# Patient Record
Sex: Female | Born: 1995 | Hispanic: Yes | Marital: Married | Smoking: Former smoker
Health system: Southern US, Community
[De-identification: ages and names within clinical notes are randomized; demographics above are authoritative.]

## PROBLEM LIST (undated history)

## (undated) ENCOUNTER — Inpatient Hospital Stay (HOSPITAL_COMMUNITY): Payer: Self-pay

## (undated) DIAGNOSIS — B999 Unspecified infectious disease: Secondary | ICD-10-CM

## (undated) DIAGNOSIS — F419 Anxiety disorder, unspecified: Secondary | ICD-10-CM

## (undated) DIAGNOSIS — R011 Cardiac murmur, unspecified: Secondary | ICD-10-CM

## (undated) HISTORY — PX: NO PAST SURGERIES: SHX2092

## (undated) HISTORY — PX: WISDOM TOOTH EXTRACTION: SHX21

## (undated) HISTORY — DX: Cardiac murmur, unspecified: R01.1

---
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
## 2015-05-16 ENCOUNTER — Ambulatory Visit (INDEPENDENT_AMBULATORY_CARE_PROVIDER_SITE_OTHER): Payer: Medicaid Other | Admitting: Pediatrics

## 2015-05-16 ENCOUNTER — Encounter: Payer: Self-pay | Admitting: Pediatrics

## 2015-05-16 VITALS — Temp 98.4°F | Wt 131.4 lb

## 2015-05-16 DIAGNOSIS — L03213 Periorbital cellulitis: Secondary | ICD-10-CM | POA: Diagnosis not present

## 2015-05-16 MED ORDER — CLINDAMYCIN HCL 300 MG PO CAPS: 300.0000 mg | ORAL_CAPSULE | Freq: Three times a day (TID) | ORAL | Status: AC

## 2015-05-16 NOTE — Patient Instructions (Signed)
Preseptal Cellulitis, Adult °Preseptal cellulitis--also called periorbital cellulitis--is an infection that can affect your eyelid and the soft tissues or skin that surround your eye. The infection may also affect the structures that produce and drain your tears. It does not affect your eye itself. °CAUSES °This condition may be caused by: °· Bacterial infection. °· Long-term (chronic) sinus infections. °· An object (foreign body) that is stuck behind the eye. °· An injury that: °¨ Goes through the eyelid tissues. °¨ Causes an infection, such as an insect sting. °· Fracture of the bone around the eye. °· Infections that have spread from the eyelid or other structures around the eye. °· Bite wounds. °· Inflammation or infection of the lining membranes of the brain (meningitis). °· An infection in the blood (septicemia). °· Dental infection (abscess). °· Viral infection. This is rare. °RISK FACTORS °Risk factors for preseptal cellulitis include: °· Participating in activities that increase your risk of trauma to the face or head, such as boxing or high-speed activities. °· Having a weakened defense system (immune system). °· Medical conditions, such as nasal polyps, that increase your risk for frequent or recurrent sinus infections. °· Not receiving regular dental care. °SYMPTOMS °Symptoms of this condition usually come on suddenly. Symptoms may include: °· Red, hot, and swollen eyelids. °· Fever. °· Difficulty opening your eye. °· Eye pain. °DIAGNOSIS °This condition may be diagnosed by an eye exam. You may also have tests, such as: °· Blood tests. °· CT scan. °· MRI. °· Spinal tap (lumbar puncture). This is a procedure that involves removing and examining a small amount of the fluid that surrounds the brain and spinal cord. This checks for meningitis. °TREATMENT °Treatment for this condition will include antibiotic medicines. These may be given by mouth (orally), through an IV, or as a shot. Your health care  provider may also recommend nasal decongestants to reduce swelling. °HOME CARE INSTRUCTIONS °· Take your antibiotic medicine as directed by your health care provider. Finish all of it even if you start to feel better. °· Take medicines only as directed by your health care provider. °· Drink enough fluid to keep your urine clear or pale yellow. °· Do not use any tobacco products, including cigarettes, chewing tobacco, or electronic cigarettes. If you need help quitting, ask your health care provider. °· Keep all follow-up visits as directed by your health care provider. These include any visits with an eye specialist (ophthalmologist) or dentist. °SEEK MEDICAL CARE IF: °· You have a fever. °· Your eyelids become more red, warm, or swollen. °· You have new symptoms. °· Your symptoms do not get better with treatment. °SEEK IMMEDIATE MEDICAL CARE IF: °· You develop double vision, or your vision becomes blurred or worsens in any way. °· You have trouble moving your eyes. °· Your eye looks like it is sticking out or bulging out (proptosis). °· You develop a severe headache, severe neck pain, or neck stiffness. °· You develop repeated vomiting. °  °This information is not intended to replace advice given to you by your health care provider. Make sure you discuss any questions you have with your health care provider. °  °Document Released: 02/06/2010 Document Revised: 05/21/2014 Document Reviewed: 12/31/2013 °Elsevier Interactive Patient Education ©2016 Elsevier Inc. ° °

## 2015-05-16 NOTE — Progress Notes (Addendum)
Subjective:     Patient ID: Peggy ConesGriselda Torres, female   DOB: [birth year], 20 y.o.   MRN: [id number]  CC: eye swelling  HPI   Peggy Torres is a previously healthy 20 y.o. female previously healthy who presents with a swollen left eyelid.  Swollen Left eyelid: Says she was scratching her eye 3 days ago. The day after she noticed a dull, non-radiating eye pain 6/10 when she woke up. She thinks the pain has improved since it initially began. She says it may be be related to wearing fake eyelashes which she started using for the first time two weeks ago. She says she started wearing new eyelashes 2 days ago and says she placed them on both eyes. She has been scratching at her eyes. Her eyelid hurts when touched and when she blinks. She has tried running her eye under warm water but says it does not help. She has no difficulty opening her eye in the morning. She has not tried treating it with any medication and says this has never happened to her before. Denies fever, nausea, vomiting, diarrhea, loss of vision, discharge, itchiness, use of contact lenses, eye redness, allergies, or sick contacts.  Review of Systems  Constitutional: Negative for chills and appetite change.  HENT: Negative for congestion.   Eyes: Negative for pain, discharge and itching.  Respiratory: Negative for shortness of breath.   Cardiovascular: Negative for chest pain.  Gastrointestinal: Negative for vomiting.  Genitourinary: Negative for decreased urine volume.  Musculoskeletal: Negative for gait problem.  Neurological: Negative for dizziness and headaches.   Normal other than in HPI    Objective:   Physical Exam  Constitutional: She is oriented to person, place, and time. She appears well-developed and well-nourished.  HENT:  Erythematous, edematous left eyelid that is tender to touch with no conjunctival involvement. Normocephalic, MMM, pink conjunctivae. No rhinorrhea.    Eyes: EOM are normal. Pupils are equal, round, and  reactive to light. Right eye exhibits no discharge. Left eye exhibits no discharge.  Neck: Normal range of motion. Neck supple.  Cardiovascular: Normal rate and regular rhythm.   Pulmonary/Chest: Effort normal and breath sounds normal. No respiratory distress.  Abdominal: Soft. Bowel sounds are normal. She exhibits no distension. There is no tenderness.  Musculoskeletal: Normal range of motion. She exhibits no edema.  Neurological: She is alert and oriented to person, place, and time. Coordination normal.  Skin: Skin is warm and dry.  See HEENT section.     Filed Vitals:   05/16/15 1404  Temp: 98.4 F (36.9 C)  Temperature 98.4 F (36.9 C), temperature source Temporal, weight 131 lb 6.4 oz (59.603 kg).    Assessment:     Peggy Torres is a 20 y.o. Female previously healthy who presents with a swollen left eye concerning for preseptal cellulitis given history of scratching prior to onset of symptoms, unilateral eyelid involvement, no sick contacts, no eye discharge, and no conjunctival involvement. She has no signs or symptoms concerning for orbital cellulitis a this time. Will prescribe clindamycin and provide strict return precautions.     Plan:     Left Preseptal Cellulitis:  -Recommend treatment with Clindamycin 3 times a day for 10 days.  -Discussed return precautions  Need for vaccination: Unable to administer adult vaccinations at our clinic today, but discussed going to health department to receive needed vaccinations. Discussed need for HPV and Hepatitis A vaccinations.      I personally saw and evaluated the patient, and participated in the management  and treatment plan as documented in the medical student's note.  Orie Rout B 05/17/2015 7:04 AM

## 2015-05-17 NOTE — Progress Notes (Signed)
Subjective:     Patient ID: Peggy ConesGriselda Torres, female   DOB: [birth year], 20 y.o.   MRN: [id number]  CC: eye swelling  Eye Problem  Pertinent negatives include no eye discharge or vomiting.     Peggy Torres is a previously healthy 20 y.o. female previously healthy who presents with a swollen left eyelid.  Swollen Left eyelid: Says she was scratching her eye 3 days ago. The day after she noticed a dull, non-radiating eye pain 6/10 when she woke up. She thinks the pain has improved since it initially began. She says it may be be related to wearing fake eyelashes which she started using for the first time two weeks ago. She says she started wearing new eyelashes 2 days ago and says she placed them on both eyes. She has been scratching at her eyes. Her eyelid hurts when touched and when she blinks. She has tried running her eye under warm water but says it does not help. She has no difficulty opening her eye in the morning. She has not tried treating it with any medication and says this has never happened to her before. Denies fever, nausea, vomiting, diarrhea, loss of vision, discharge, itchiness, use of contact lenses, eye redness, allergies, or sick contacts.  Review of Systems  Constitutional: Negative for chills and appetite change.  HENT: Negative for congestion.   Eyes: Negative for pain, discharge and itching.  Respiratory: Negative for shortness of breath.   Cardiovascular: Negative for chest pain.  Gastrointestinal: Negative for vomiting.  Genitourinary: Negative for decreased urine volume.  Musculoskeletal: Negative for gait problem.  Neurological: Negative for dizziness and headaches.   Normal other than in HPI    Objective:   Physical Exam  Constitutional: She is oriented to person, place, and time. She appears well-developed and well-nourished.  HENT:  Erythematous, edematous left eyelid that is tender to touch with no conjunctival involvement. Normocephalic, MMM, pink conjunctivae.  No rhinorrhea.    Eyes: EOM are normal. Pupils are equal, round, and reactive to light. Right eye exhibits no discharge. Left eye exhibits no discharge.  Neck: Normal range of motion. Neck supple.  Cardiovascular: Normal rate and regular rhythm.   Pulmonary/Chest: Effort normal and breath sounds normal. No respiratory distress.  Abdominal: Soft. Bowel sounds are normal. She exhibits no distension. There is no tenderness.  Musculoskeletal: Normal range of motion. She exhibits no edema.  Neurological: She is alert and oriented to person, place, and time. Coordination normal.  Skin: Skin is warm and dry.  See HEENT section.     Filed Vitals:   05/16/15 1404  Temp: 98.4 F (36.9 C)  Temperature 98.4 F (36.9 C), temperature source Temporal, weight 131 lb 6.4 oz (59.603 kg).    Assessment:     Peggy Torres is a 20 y.o. Female previously healthy who presents with a swollen left eye concerning for preseptal cellulitis given history of scratching prior to onset of symptoms, unilateral eyelid involvement, no sick contacts, no eye discharge, and no conjunctival involvement. She has no signs or symptoms concerning for orbital cellulitis a this time. Will prescribe clindamycin and provide strict return precautions.     Plan:     Left Preseptal Cellulitis:  -Recommend treatment with Clindamycin 3 times a day for 10 days.  -Discussed return precautions  Need for vaccination: Unable to administer adult vaccinations at our clinic today, but discussed going to health department to receive needed vaccinations. Discussed need for HPV and Hepatitis A vaccinations.  I saw and examined the patient, agree with the medical student and have made any necessary additions or changes to the above note.

## 2015-06-24 ENCOUNTER — Encounter: Payer: Self-pay | Admitting: Pediatrics

## 2015-06-24 ENCOUNTER — Ambulatory Visit (INDEPENDENT_AMBULATORY_CARE_PROVIDER_SITE_OTHER): Payer: Medicaid Other | Admitting: Pediatrics

## 2015-06-24 VITALS — BP 104/62 | Ht 63.5 in | Wt 134.0 lb

## 2015-06-24 DIAGNOSIS — R6889 Other general symptoms and signs: Secondary | ICD-10-CM | POA: Diagnosis not present

## 2015-06-24 DIAGNOSIS — Z973 Presence of spectacles and contact lenses: Secondary | ICD-10-CM

## 2015-06-24 DIAGNOSIS — Z308 Encounter for other contraceptive management: Secondary | ICD-10-CM

## 2015-06-24 DIAGNOSIS — H579 Unspecified disorder of eye and adnexa: Secondary | ICD-10-CM | POA: Diagnosis not present

## 2015-06-24 DIAGNOSIS — R238 Other skin changes: Secondary | ICD-10-CM

## 2015-06-24 DIAGNOSIS — R233 Spontaneous ecchymoses: Secondary | ICD-10-CM | POA: Insufficient documentation

## 2015-06-24 DIAGNOSIS — R5383 Other fatigue: Secondary | ICD-10-CM | POA: Insufficient documentation

## 2015-06-24 DIAGNOSIS — Z3202 Encounter for pregnancy test, result negative: Secondary | ICD-10-CM

## 2015-06-24 DIAGNOSIS — L700 Acne vulgaris: Secondary | ICD-10-CM | POA: Diagnosis not present

## 2015-06-24 DIAGNOSIS — Z113 Encounter for screening for infections with a predominantly sexual mode of transmission: Secondary | ICD-10-CM

## 2015-06-24 DIAGNOSIS — Z0001 Encounter for general adult medical examination with abnormal findings: Secondary | ICD-10-CM

## 2015-06-24 DIAGNOSIS — N946 Dysmenorrhea, unspecified: Secondary | ICD-10-CM | POA: Diagnosis not present

## 2015-06-24 LAB — COMPREHENSIVE METABOLIC PANEL
ALK PHOS: 66 U/L (ref 47–176)
ALT: 16 U/L (ref 5–32)
AST: 17 U/L (ref 12–32)
Albumin: 4.6 g/dL (ref 3.6–5.1)
BUN: 14 mg/dL (ref 7–20)
CALCIUM: 9.3 mg/dL (ref 8.9–10.4)
CO2: 21 mmol/L (ref 20–31)
Chloride: 104 mmol/L (ref 98–110)
Creat: 0.58 mg/dL (ref 0.50–1.00)
GLUCOSE: 58 mg/dL — AB (ref 65–99)
POTASSIUM: 4.2 mmol/L (ref 3.8–5.1)
Sodium: 138 mmol/L (ref 135–146)
Total Bilirubin: 0.4 mg/dL (ref 0.2–1.1)
Total Protein: 6.7 g/dL (ref 6.3–8.2)

## 2015-06-24 LAB — CBC WITH DIFFERENTIAL/PLATELET
BASOS PCT: 0 %
Basophils Absolute: 0 cells/uL (ref 0–200)
Eosinophils Absolute: 158 cells/uL (ref 15–500)
Eosinophils Relative: 2 %
HEMATOCRIT: 42 % (ref 35.0–45.0)
Hemoglobin: 14 g/dL (ref 11.7–15.5)
Lymphocytes Relative: 29 %
Lymphs Abs: 2291 cells/uL (ref 850–3900)
MCH: 28.6 pg (ref 27.0–33.0)
MCHC: 33.3 g/dL (ref 32.0–36.0)
MCV: 85.9 fL (ref 80.0–100.0)
MONO ABS: 711 {cells}/uL (ref 200–950)
MONOS PCT: 9 %
MPV: 8.8 fL (ref 7.5–12.5)
NEUTROS PCT: 60 %
Neutro Abs: 4740 cells/uL (ref 1500–7800)
PLATELETS: 299 10*3/uL (ref 140–400)
RBC: 4.89 MIL/uL (ref 3.80–5.10)
RDW: 13.7 % (ref 11.0–15.0)
WBC: 7.9 10*3/uL (ref 3.8–10.8)

## 2015-06-24 LAB — POCT URINE PREGNANCY: PREG TEST UR: NEGATIVE

## 2015-06-24 LAB — TSH: TSH: 0.73 mIU/L (ref 0.50–4.30)

## 2015-06-24 LAB — POCT RAPID HIV: RAPID HIV, POC: NEGATIVE

## 2015-06-24 LAB — PROTIME-INR
INR: 1 (ref <1.50)
Prothrombin Time: 13.3 seconds (ref 11.6–15.2)

## 2015-06-24 LAB — APTT: APTT: 35 s (ref 24–37)

## 2015-06-24 MED ORDER — BENZACLIN 1-5 % EX GEL: Freq: Two times a day (BID) | CUTANEOUS | Status: DC

## 2015-06-24 MED ORDER — NAPROXEN 500 MG PO TABS: 500.0000 mg | ORAL_TABLET | Freq: Two times a day (BID) | ORAL | Status: DC | PRN

## 2015-06-24 NOTE — Progress Notes (Signed)
Adolescent Well Care Visit Peggy Torres is a 20 y.o. female who is here for well care.    PCP:  Heber [place], MD   History was provided by the patient.  Current Issues: Current concerns include feeling very tired lately in spite of getting adequate rest.   She also reports easy bruising over the past 5 months, though she does not report any changes to her menses.   Nutrition: Nutrition/Eating Behaviors: lots of fast food Adequate calcium in diet?: no Supplements/ Vitamins: no  Exercise/ Media: Play any Sports?/ Exercise: walking Screen Time:  > 2 hours-counseling provided   Sleep:  Sleep: goes to bed at 10 AM and wakes at 8 AM.  Sleeps all night.  No difficulty falling asleep  Social Screening: Lives with:  With friends Parental relations:  family problems - arguing with uncle Activities, Work, and Regulatory affairs officer?: working with IT consultant Stressors of note: yes - not getting along with uncle  Education: Not in school  Menstruation:   Patient's last menstrual period was 06/16/2015 (within days). Menstrual History: regular every 4-6 weeks, still having some cramping, needs more naproxen   Tobacco?  yes, 2-3 cigarettes per week Secondhand smoke exposure?  Friends smoke too Drugs/ETOH?  no  Sexually Active?  yes - boyfriend (1 partner in past year) Pregnancy Prevention: condoms  Safe at home, in school & in relationships?  Yes Safe to self?  Yes   Screenings: Patient has a dental home: yes  The patient completed the Rapid Assessment for Adolescent Preventive Services screening questionnaire and the following topics were identified as risk factors and discussed: tobacco use  In addition, the following topics were discussed as part of anticipatory guidance tobacco use, marijuana use, drug use, condom use and birth control.  PHQ-9 completed and results indicated no signs of depression (total score of 5, 3 for feeling tired or having little energy)  Physical  Exam:  Filed Vitals:   06/24/15 0935  BP: 104/62  Height: 5' 3.5" (1.613 m)  Weight: 134 lb (60.782 kg)   BP 104/62 mmHg  Ht 5' 3.5" (1.613 m)  Wt 134 lb (60.782 kg)  BMI 23.36 kg/m2  LMP 06/16/2015 (Within Days) Body mass index: body mass index is 23.36 kg/(m^2). Blood pressure percentiles are 33% systolic and 46% diastolic based on 2000 NHANES data. Blood pressure percentile targets: 90: 122/77, 95: 126/81, 99 + 5 mmHg: 138/94.   Hearing Screening   Method: Audiometry           Right ear:   Left ear:   Visual Acuity Screening   Right eye Left eye Both eyes  Without correction: 10/12 10/15   With correction:       General Appearance:   alert, oriented, no acute distress and well nourished  HENT: Normocephalic, no obvious abnormality, conjunctiva clear  Mouth:   Normal appearing teeth, no obvious discoloration, dental caries, or dental caps  Neck:   Supple; thyroid: no enlargement, symmetric, no tenderness/mass/nodules  Chest Breast if female: 4  Lungs:   Clear to auscultation bilaterally, normal work of breathing  Heart:   Regular rate and rhythm, S1 and S2 normal, no murmurs;   Abdomen:   Soft, non-tender, no mass, or organomegaly  GU normal female external genitalia, pelvic not performed, Tanner stage V  Musculoskeletal:   Tone and strength strong and symmetrical, all extremities  Lymphatic:   No cervical adenopathy  Skin/Hair/Nails:   Skin warm, dry and intact, no rashes, no bruises or petechiae  Neurologic:   Strength, gait, and coordination normal and age-appropriate     Assessment and Plan:   1. Routine screening for STI (sexually transmitted infection) Condoms given in clinic.   - GC/Chlamydia Probe Amp - POCT Rapid HIV - negative  3. Wears glasses Advised to wear regularly.   4. Acne vulgaris Refilled benzaclin. - BENZACLIN gel; Apply topically 2 (two) times daily. To  acne on face  Dispense: 50 g; Refill: 11  5. Negative pregnancy test - POCT urine pregnancy  6. Encounter for other contraceptive management Reviewed contraceptive options with patient.  She requests referral to Gyn for further discussion. - Ambulatory referral to Obstetrics / Gynecology  7. Other fatigue PHQ-9 is negative for signs of depression.  Will obtain labs as noted below to evaluate further.  Supportive cares, return precautions, and emergency procedures reviewed. - CBC with Differential/Platelet - Comprehensive metabolic panel - TSH - VITAMIN D 25 Hydroxy (Vit-D Deficiency, Fractures) - Protime-INR - APTT  8. Easy bruising No bruising noted on exam.  No other signs of coagulopathy.  Will obtain screening labs for bleeding disorder.  Return precautions reviewed. - CBC with Differential/Platelet - TSH - Protime-INR - APTT  9. Dysmenorrhea Refilled Naproxen.  Supportive cares, return precautions, and emergency procedures reviewed.   BMI is appropriate for age  Hearing screening result:normal Vision screening result: abnormal - wears glasses, but does not have them today.   Return if symptoms worsen or fail to improve.Marland Kitchen.  Alanee Ting, Betti CruzKATE S, MD

## 2015-06-24 NOTE — Patient Instructions (Signed)
Acne Plan  Products: Face Wash:  Use a gentle cleanser, such as Cetaphil (generic version of this is fine) Moisturizer:  Use an "oil-free" moisturizer with SPF Prescription Cream(s):  benzaclin in the morning and benzaclin at bedtime  Morning: Wash face, then completely dry Apply benzaclin, pea size amount that you massage into problem areas on the face. Apply Moisturizer to entire face  Bedtime: Wash face, then completely dry Apply benzaclin, pea size amount that you massage into problem areas on the face.  Remember: - Your acne will probably get worse before it gets better - It takes at least 2 months for the medicines to start working - Use oil free soaps and lotions; these can be over the counter or store-brand - Don't use harsh scrubs or astringents, these can make skin irritation and acne worse - Moisturize daily with oil free lotion because the acne medicines will dry your skin  Call your doctor if you have: - Lots of skin dryness or redness that doesn't get better if you use a moisturizer or if you use the prescription cream or lotion every other day    Stop using the acne medicine immediately and see your doctor if you are or become pregnant or if you think you had an allergic reaction (itchy rash, difficulty breathing, nausea, vomiting) to your acne medication.   Usted puede dejar de fumar (You Can Quit Smoking) Si usted est listo para dejar de fumar o est pensando en ello, felicitaciones! Ha elegido tener una vida ms sana y vivir ms tiempo. Hay muchas formas de dejar de fumar. Los chicles, parches, inhaladores o aerosol nasal con nicotina pueden ayudarlo con el deseo fsico. La hipnosis, los grupos de apoyo y algunos medicamentos tambin pueden ayudarlo. CONSEJOS PARA DEJAR EL HBITO Y NO VOLVER A FUMAR  Aprenda a predecir sus estados de nimo. Evite que un mal momento sea una excusa para fumar. Algunas situaciones podrn tentarlo.  Pdale a sus amigos y  compaeros que no fumen a su alrededor.  Haga de su hogar un lugar libre de humo.  Nunca diga "solo uno". Esto lleva a desear otro y otro. Recurdese a usted mismo su decisin de dejar de fumar.  En un papel, haga una lista de sus razones para no fumar. Lalo al menos el mismo nmero de veces al da en que fume un cigarrillo. Dgase a usted [employer], "No quiero fumar Elijo no fumar "  Pdale a otra persona de su hogar o del trabajo que lo ayude con su plan para dejar de fumar.  Planifique algo para hacer enseguida despus de comer o cuando toma una taza de caf. Salga a caminar o haga algn ejercicio para animarse. Esto lo ayudar a Environmental health practitioner.  Haga ejercicios de relajacin para calmarse y Theatre manager. Recuerde, puede estar tenso y [employer] primeras semanas. Esto pasar.  Encuentre nuevas actividades para [employer] ocupadas Juegue con [place] lapicera, una moneda o una bandita elstica. Garabatee o dibuje en un papel.  Cepllese los dientes enseguida despus de comer. Esto lo ayudar a Land deseo del tabaco luego de las comidas. Tambin puede probar con un enjuague bucal.  Pruebe con caramelos de goma, pastillas de menta, o caramelos dietticos para mantener algo en la boca. SI USTED FUMA Y QUIERE DEJAR EL HBITO:  Evite abastecerse de cigarrillos. Nunca compre un cartn. Espere a que un paquete se termine antes de Multimedia programmer.  Nunca lleve  los cigarrillos con usted en el trabajo o en su casa.  Mantngalos lo ms lejos posible. Djeselos a Engineer, maintenance (IT)otra persona.  Nunca lleve los fsforos o el encendedor con usted.  Siempre pregntese: "Necesito este cigarrillo o slo es un reflejo?"  Haga una apuesta con alguien a que puede dejar de fumar. Ponga el dinero de los cigarrillos en una alcanca cada [place]. Si usted fuma, pierde el dinero. Si no fuma, al final de la semana, tendr ms dinero.  Siga intentando. Se tarda 21 das en cambiar  un hbito!  Hable con su mdico acerca del uso de medicamentos para dejar de fumar. Entre ellos se incluyen la goma de Theatre managermascar, Designer, industrial/productpastillas o parches para la piel como reemplazo de la nicotina.   Esta informacin no tiene Theme park managercomo fin reemplazar el consejo del mdico. Asegrese de hacerle al mdico cualquier pregunta que tenga.   Document Released: 02/06/2010 Document Revised: 03/29/2011 Elsevier Interactive Patient Education [employer]2016 Elsevier Inc.

## 2015-06-25 LAB — GC/CHLAMYDIA PROBE AMP
CT PROBE, AMP APTIMA: NOT DETECTED
GC Probe RNA: NOT DETECTED

## 2015-06-25 LAB — VITAMIN D 25 HYDROXY (VIT D DEFICIENCY, FRACTURES): Vit D, 25-Hydroxy: 18 ng/mL — ABNORMAL LOW (ref 30–100)

## 2015-06-27 MED ORDER — VITAMIN D (ERGOCALCIFEROL) 1.25 MG (50000 UNIT) PO CAPS: 50000.0000 [IU] | ORAL_CAPSULE | ORAL | Status: DC

## 2015-06-27 NOTE — Addendum Note (Signed)
Addended byVoncille Lo: Jameil Whitmoyer on: 06/27/2015 03:41 PM   Modules accepted: Orders

## 2015-07-30 ENCOUNTER — Encounter: Payer: Self-pay | Admitting: Obstetrics & Gynecology

## 2015-08-01 ENCOUNTER — Ambulatory Visit (INDEPENDENT_AMBULATORY_CARE_PROVIDER_SITE_OTHER): Payer: Medicaid Other | Admitting: Pediatrics

## 2015-08-01 ENCOUNTER — Encounter: Payer: Self-pay | Admitting: Pediatrics

## 2015-08-01 VITALS — Temp 97.7°F | Wt 132.2 lb

## 2015-08-01 DIAGNOSIS — R300 Dysuria: Secondary | ICD-10-CM

## 2015-08-01 DIAGNOSIS — E559 Vitamin D deficiency, unspecified: Secondary | ICD-10-CM | POA: Diagnosis not present

## 2015-08-01 DIAGNOSIS — Z3202 Encounter for pregnancy test, result negative: Secondary | ICD-10-CM | POA: Diagnosis not present

## 2015-08-01 DIAGNOSIS — N390 Urinary tract infection, site not specified: Secondary | ICD-10-CM | POA: Diagnosis not present

## 2015-08-01 LAB — POCT URINALYSIS DIPSTICK
BILIRUBIN UA: NEGATIVE
GLUCOSE UA: NEGATIVE
Ketones, UA: NEGATIVE
Nitrite, UA: POSITIVE
SPEC GRAV UA: 1.015
UROBILINOGEN UA: NEGATIVE
pH, UA: 6

## 2015-08-01 LAB — POCT URINE PREGNANCY: PREG TEST UR: NEGATIVE

## 2015-08-01 MED ORDER — NITROFURANTOIN MONOHYD MACRO 100 MG PO CAPS: 100.0000 mg | ORAL_CAPSULE | Freq: Two times a day (BID) | ORAL | Status: AC

## 2015-08-01 NOTE — Progress Notes (Signed)
History was provided by the patient.  Peggy Torres is a 20 y.o. female who is here for burning with urination, concern for a UTI.     HPI:   Chief Complaint  Patient presents with  . URINARY FREQUENCY    FREQUENT URINATION ABOUT 3 DAYS AGO, THAT HAS SINCE STOPPED AND NOT IT BURNS WHEN PATIENT PEES AND SOME PRESSURE  . Pelvic Pain   Had UTI 6 months before and felt the same. Got better with cranberry juice.   Last time had sex was 1week ago. No known STI, no history of STI. LMP about 1 month ago, sometimes uses condoms when having sex. No other birth control. No unusual vaginal discharge.   Feels like pain inside the vagina. Sometimes hurts when having sex. No abnormal bleeding.  No kidney problems.   Bleeds for 4-6 days, 2 pads in a day, sometimes misses school.  ROS: No vomiting, diarrhea, decreased po, fever, fatigue, hematuria, vaginal discharge, rash.  The following portions of the patient's history were reviewed and updated as appropriate: allergies, current medications, past family history, past medical history, past social history, past surgical history and problem list.  Physical Exam:  Temp(Src) 97.7 F (36.5 C) (Temporal)  Wt 132 lb 3.2 oz (59.966 kg)  LMP  (LMP Unknown)  No blood pressure reading on file for this encounter. No LMP recorded (lmp unknown).    General:   alert, cooperative, appears stated age and no distress  Skin:   normal  Oral cavity:   moist mucous membranes  Lungs:  clear to auscultation bilaterally  Heart:   regular rate and rhythm, S1, S2 normal, no murmur, click, rub or gallop   Abdomen:  soft, tenderness in suprapubic area, no rigidity or guarding, normal bowel sounds. No CVA tenderness.  GU:  no inguinal lymph nodes  Neuro:  normal without focal findings    Assessment/Plan: Peggy Torres is a 20 y.o. female who is here for burning with urination. U/A with moderate LE, + nitrite. Will treat for UTI. No fever, CVA tenderness, or  other systemic symptoms to have concern of pyelonephritis.  1. UTI (lower urinary tract infection) - POCT urinalysis dipstick - Urine culture - nitrofurantoin, macrocrystal-monohydrate, (MACROBID) 100 MG capsule; Take 1 capsule (100 mg total) by mouth 2 (two) times daily.  Dispense: 14 capsule; Refill: 0  2. Dysuria - POCT urinalysis dipstick - POCT urine pregnancy - GC/Chlamydia Probe Amp  3. Vitamin D deficiency - completed 4 week course of weekly vitamin D - PCP wanted to check vit D in 6 weeks - VITAMIN D 25 Hydroxy (Vit-D Deficiency, Fractures)  - Immunizations today: none  - Follow-up visit as needed.   Karmen StabsE. Paige Jazmynn Pho, MD [facility], PGY-3 08/01/2015  9:48 AM

## 2015-08-01 NOTE — Patient Instructions (Signed)
  Infeccin urinaria  (Urinary Tract Infection)  La infeccin urinaria puede ocurrir en cualquier lugar del tracto urinario. El tracto urinario es un sistema de drenaje del cuerpo por el que se eliminan los desechos y el exceso de agua. El tracto urinario est formado por dos riones, dos urteres, la vejiga y la uretra. Los riones son rganos que tienen forma de frijol. Cada rin tiene aproximadamente el tamao del puo. Estn situados debajo de las costillas, uno a cada lado de la columna vertebral CAUSAS  La causa de la infeccin son los microbios, que son organismos microscpicos, que incluyen hongos, virus, y bacterias. Estos organismos son tan pequeos que slo pueden verse a travs del microscopio. Las bacterias son los microorganismos que ms comnmente causan infecciones urinarias.  SNTOMAS  Los sntomas pueden variar segn la edad y el sexo del paciente y por la ubicacin de la infeccin. Los sntomas en las mujeres jvenes incluyen la necesidad frecuente e intensa de orinar y una sensacin dolorosa de ardor en la vejiga o en la uretra durante la miccin. Las mujeres y los hombres mayores podrn sentir cansancio, temblores y debilidad y sentir dolores musculares y dolor abdominal. Si tiene fiebre, puede significar que la infeccin est en los riones. Otros sntomas son dolor en la espalda o en los lados debajo de las costillas, nuseas y vmitos.  DIAGNSTICO  Para diagnosticar una infeccin urinaria, el mdico le preguntar acerca de sus sntomas. Tambin le solicitar una muestra de orina. La muestra de orina se analiza para detectar bacterias y glbulos blancos de la sangre. Los glbulos blancos se forman en el organismo para ayudar a combatir las infecciones.  TRATAMIENTO  Por lo general, las infecciones urinarias pueden tratarse con medicamentos. Debido a que la mayora de las infecciones son causadas por bacterias, por lo general pueden tratarse con antibiticos. La eleccin del  antibitico y la duracin del tratamiento depender de sus sntomas y el tipo de bacteria causante de la infeccin.  INSTRUCCIONES PARA EL CUIDADO EN EL HOGAR   Si le recetaron antibiticos, tmelos exactamente como su mdico le indique. Termine el medicamento aunque se sienta mejor despus de haber tomado slo algunos.  Beba gran cantidad de lquido para mantener la orina de tono claro o color amarillo plido.  Evite la cafena, el t y las bebidas gaseosas. Estas sustancias irritan la vejiga.  Vaciar la vejiga con frecuencia. Evite retener la orina durante largos perodos.  Vace la vejiga antes y despus de tener relaciones sexuales.  Despus de mover el intestino, las mujeres deben higienizarse la regin perineal desde adelante hacia atrs. Use slo un papel tissue por vez. SOLICITE ATENCIN MDICA SI:   Siente dolor en la espalda.  Le sube la fiebre.  Los sntomas no mejoran luego de 3 das. SOLICITE ATENCIN MDICA DE INMEDIATO SI:   Siente dolor intenso en la espalda o en la zona inferior del abdomen.  Comienza a sentir escalofros.  Tiene nuseas o vmitos.  Tiene una sensacin continua de quemazn o molestias al orinar. ASEGRESE DE QUE:   Comprende estas instrucciones.  Controlar su enfermedad.  Solicitar ayuda de inmediato si no mejora o empeora.   Esta informacin no tiene como fin reemplazar el consejo del mdico. Asegrese de hacerle al mdico cualquier pregunta que tenga.   Document Released: 10/14/2004 Document Revised: 09/29/2011 Elsevier Interactive Patient Education 2016 Elsevier Inc.  

## 2015-08-02 LAB — VITAMIN D 25 HYDROXY (VIT D DEFICIENCY, FRACTURES): VIT D 25 HYDROXY: 38 ng/mL (ref 30–100)

## 2015-08-02 LAB — GC/CHLAMYDIA PROBE AMP
CT Probe RNA: NOT DETECTED
GC Probe RNA: NOT DETECTED

## 2015-08-04 LAB — URINE CULTURE

## 2015-08-07 ENCOUNTER — Telehealth: Payer: Self-pay

## 2015-08-07 NOTE — Telephone Encounter (Signed)
Called at Dr. Charolette ForwardEttefagh's request. Peggy Torres is taking antibiotics and is feeling better; encouraged her to finish the antibiotics as prescribed. Told her that the GC/CT test done was negative. Told her that VitD is now in normal range; she should continue daily MVI and VitD supplement. Peggy Torres asked if another RX for [facility] [address] [address] Routing this message to Dr. Luna FuseEttefagh for follow up.

## 2015-08-08 NOTE — Telephone Encounter (Signed)
The Vitamin D level is now in the normal range. She only needs to take a multi vitamin daily. It will have 600IU of Vit D in it. This is the daily recommended dose. This can be purchased OTC. No prescription needed.

## 2015-09-05 ENCOUNTER — Encounter: Payer: Self-pay | Admitting: Obstetrics & Gynecology

## 2015-09-05 ENCOUNTER — Ambulatory Visit (INDEPENDENT_AMBULATORY_CARE_PROVIDER_SITE_OTHER): Payer: Medicaid Other | Admitting: Obstetrics & Gynecology

## 2015-09-05 VITALS — BP 124/68 | Wt 130.2 lb

## 2015-09-05 DIAGNOSIS — Z30011 Encounter for initial prescription of contraceptive pills: Secondary | ICD-10-CM | POA: Diagnosis present

## 2015-09-05 LAB — POCT PREGNANCY, URINE: Preg Test, Ur: NEGATIVE

## 2015-09-05 MED ORDER — NORGESTREL-ETHINYL ESTRADIOL 0.3-30 MG-MCG PO TABS: 1.0000 | ORAL_TABLET | Freq: Every day | ORAL | 11 refills | Status: DC

## 2015-09-05 NOTE — Progress Notes (Addendum)
   Subjective:    Patient ID: Valarie ConesGriselda Arciniega, female    DOB: 09/17/1995, 20 y.o.   MRN: [id number]  HPI  20yo SHG0 here today for OCP prescription. She has been monogamous for about a year. She uses condoms currently.   Review of Systems     Objective:   Physical Exam [id number] Breathing, conversing, and ambulating normally Abd- benign      Assessment & Plan:  Contraception- prescribed generic lo ovral BP in 2 months Rec back up for a month Sign ROI for her peds so we can find out if she had Gardasil as she does not recall

## 2015-10-23 ENCOUNTER — Emergency Department (HOSPITAL_COMMUNITY): Payer: Medicaid Other

## 2015-10-23 ENCOUNTER — Emergency Department (HOSPITAL_COMMUNITY)
Admission: EM | Admit: 2015-10-23 | Discharge: 2015-10-23 | Disposition: A | Discharge status: Home / Self Care | Payer: Medicaid Other | Attending: Emergency Medicine | Admitting: Emergency Medicine

## 2015-10-23 ENCOUNTER — Encounter (HOSPITAL_COMMUNITY): Payer: Self-pay | Admitting: Emergency Medicine

## 2015-10-23 DIAGNOSIS — Z79899 Other long term (current) drug therapy: Secondary | ICD-10-CM | POA: Insufficient documentation

## 2015-10-23 DIAGNOSIS — S161XXA Strain of muscle, fascia and tendon at neck level, initial encounter: Secondary | ICD-10-CM | POA: Diagnosis not present

## 2015-10-23 DIAGNOSIS — Y9389 Activity, other specified: Secondary | ICD-10-CM | POA: Diagnosis not present

## 2015-10-23 DIAGNOSIS — F17210 Nicotine dependence, cigarettes, uncomplicated: Secondary | ICD-10-CM | POA: Insufficient documentation

## 2015-10-23 DIAGNOSIS — Y92410 Unspecified street and highway as the place of occurrence of the external cause: Secondary | ICD-10-CM | POA: Diagnosis not present

## 2015-10-23 DIAGNOSIS — R51 Headache: Secondary | ICD-10-CM | POA: Diagnosis not present

## 2015-10-23 DIAGNOSIS — S20211A Contusion of right front wall of thorax, initial encounter: Secondary | ICD-10-CM

## 2015-10-23 DIAGNOSIS — M542 Cervicalgia: Secondary | ICD-10-CM | POA: Diagnosis present

## 2015-10-23 DIAGNOSIS — Y999 Unspecified external cause status: Secondary | ICD-10-CM | POA: Insufficient documentation

## 2015-10-23 LAB — I-STAT BETA HCG BLOOD, ED (MC, WL, AP ONLY): I-stat hCG, quantitative: <5 m[IU]/mL (ref <5)

## 2015-10-23 MED ORDER — CYCLOBENZAPRINE HCL 10 MG PO TABS: 10.0000 mg | ORAL_TABLET | Freq: Two times a day (BID) | ORAL | 0 refills | Status: DC | PRN

## 2015-10-23 MED ORDER — IBUPROFEN 600 MG PO TABS: 600.0000 mg | ORAL_TABLET | Freq: Four times a day (QID) | ORAL | 0 refills | Status: DC | PRN
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
## 2015-11-14 ENCOUNTER — Ambulatory Visit (INDEPENDENT_AMBULATORY_CARE_PROVIDER_SITE_OTHER): Payer: Medicaid Other | Admitting: Pediatrics

## 2015-11-14 ENCOUNTER — Encounter: Payer: Self-pay | Admitting: Pediatrics

## 2015-11-14 ENCOUNTER — Ambulatory Visit (INDEPENDENT_AMBULATORY_CARE_PROVIDER_SITE_OTHER): Payer: Medicaid Other | Admitting: Licensed Clinical Social Worker

## 2015-11-14 VITALS — Temp 98.7°F | Wt 134.0 lb

## 2015-11-14 DIAGNOSIS — F4322 Adjustment disorder with anxiety: Secondary | ICD-10-CM
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
## 2015-11-28 ENCOUNTER — Ambulatory Visit: Payer: Medicaid Other | Admitting: Pediatrics

## 2016-08-19 ENCOUNTER — Encounter (HOSPITAL_COMMUNITY): Payer: Self-pay | Admitting: *Deleted

## 2016-08-19 ENCOUNTER — Emergency Department (HOSPITAL_COMMUNITY)
Admission: EM | Admit: 2016-08-19 | Discharge: 2016-08-19 | Disposition: A | Discharge status: Home / Self Care | Payer: Medicaid Other | Attending: Emergency Medicine | Admitting: Emergency Medicine

## 2016-08-19 ENCOUNTER — Encounter: Payer: Self-pay | Admitting: *Deleted

## 2016-08-19 ENCOUNTER — Ambulatory Visit (INDEPENDENT_AMBULATORY_CARE_PROVIDER_SITE_OTHER): Payer: Self-pay | Admitting: *Deleted

## 2016-08-19 DIAGNOSIS — Z3201 Encounter for pregnancy test, result positive: Secondary | ICD-10-CM

## 2016-08-19 DIAGNOSIS — J0100 Acute maxillary sinusitis, unspecified: Secondary | ICD-10-CM | POA: Diagnosis not present

## 2016-08-19 DIAGNOSIS — Z3A01 Less than 8 weeks gestation of pregnancy: Secondary | ICD-10-CM

## 2016-08-19 DIAGNOSIS — F17210 Nicotine dependence, cigarettes, uncomplicated: Secondary | ICD-10-CM | POA: Insufficient documentation

## 2016-08-19 DIAGNOSIS — R51 Headache: Secondary | ICD-10-CM | POA: Diagnosis present

## 2016-08-19 LAB — POCT PREGNANCY, URINE: Preg Test, Ur: POSITIVE — AB

## 2016-08-19 NOTE — ED Provider Notes (Addendum)
[facility]ER[facility] DEPT Provider Note   CSN: [id number] Arrival date & time: 08/19/16  1936     History   Chief Complaint Chief Complaint  Patient presents with  . Facial Pain    HPI Peggy Torres is a 21 y.o. female.  The history is provided by the patient.  Headache   This is a new problem. The current episode started 2 days ago. The problem occurs constantly. The problem has been gradually worsening. The headache is associated with coughing (and URI sx). The pain is located in the left unilateral region. The pain is moderate. The pain radiates to the face. Pertinent negatives include no fever, no malaise/fatigue, no shortness of breath and no nausea. Associated symptoms comments: No otalgia, or visual changes .   Patient reports worsening of the pain with tilting her head back and forward.  Patient was seen by the dentist 2 days ago who told her that she had a tooth infection, and provided patient with Pen-VK as well as Tylenol 3. Patient was also told that she needed a tooth extracted.  Reports that her pain is not improved with the medication.   Past Medical History:  Diagnosis Date  . Murmur    as a child    Patient Active Problem List   Diagnosis Date Noted  . Other fatigue 06/24/2015  . Easy bruising 06/24/2015  . Wears glasses 05/25/2013  . Dysmenorrhea 05/25/2013  . Acne vulgaris 05/25/2013    History reviewed. No pertinent surgical history.  OB History    Gravida Para Term Preterm AB Living   1 0 0 0 0 0   SAB TAB Ectopic Multiple Live Births   0 0 0 0 0       Home Medications    Prior to Admission medications   Medication Sig Start Date End Date Taking? Authorizing Provider  acetaminophen-codeine (TYLENOL #3) 300-30 MG tablet Take 1 tablet by mouth every 6 (six) hours as needed for moderate pain or severe pain.   Yes [provider]  penicillin v potassium (VEETID) 500 MG tablet Take 500 mg by mouth 3 (three) times daily.   Yes [provider]  acetaminophen (TYLENOL) 500 MG tablet Take 1,000 mg by mouth every 6 (six) hours as needed for moderate pain.     [provider]  BENZACLIN gel Apply topically 2 (two) times daily. To acne on face Patient not taking: Reported on 11/14/2015 06/24/15   Voncille LoEttefagh, Kate, MD  cyclobenzaprine (FLEXERIL) 10 MG tablet Take 1 tablet (10 mg total) by mouth 2 (two) times daily as needed for muscle spasms. Patient not taking: Reported on 11/14/2015 10/23/15   Rolan BuccoBelfi, Melanie, MD  ibuprofen (ADVIL,MOTRIN) 600 MG tablet Take 1 tablet (600 mg total) by mouth every 6 (six) hours as needed. Patient not taking: Reported on 11/14/2015 10/23/15   Rolan BuccoBelfi, Melanie, MD  naproxen (NAPROSYN) 500 MG tablet Take 1 tablet (500 mg total) by mouth every 12 (twelve) hours as needed. For menstrual cramps Patient not taking: Reported on 11/14/2015 06/24/15   Voncille LoEttefagh, Kate, MD  norgestrel-ethinyl estradiol (LO/OVRAL,CRYSELLE) 0.3-30 MG-MCG tablet Take 1 tablet by mouth daily. Patient not taking: Reported on 11/14/2015 09/05/15   Allie Bossierove, Myra C, MD  Vitamin D, Ergocalciferol, (DRISDOL) 50000 units CAPS capsule Take 1 capsule (50,000 Units total) by mouth every 7 (seven) days. Patient not taking: Reported on 11/14/2015 06/27/15   Voncille LoEttefagh, Kate, MD    Family History Family History  Problem Relation Age of Onset  .  Diabetes Maternal Grandmother   . Diabetes Maternal Grandfather   . Diabetes Paternal Grandmother   . Diabetes Paternal Grandfather     Social History Social History  Substance Use Topics  . Smoking status: Current Some Day Smoker    Types: Cigarettes  . Smokeless tobacco: Never Used  . Alcohol use 0.0 oz/week     Allergies   Patient has no known allergies.   Review of Systems Review of Systems  Constitutional: Negative for fever and malaise/fatigue.  Respiratory: Negative for shortness of breath.   Gastrointestinal: Negative for nausea.  Neurological: Positive for headaches.   All  other systems are reviewed and are negative for acute change except as noted in the HPI   Physical Exam Updated Vital Signs BP 125/90 (BP Location: Left Arm)   Pulse 93   Temp 98.5 F (36.9 C) (Oral)   Resp (!) 24   Ht 5' 2.99" (1.6 m)   Wt 59 kg (130 lb)   LMP 07/02/2016 (Exact Date)   SpO2 100%   BMI 23.03 kg/m   Physical Exam  Constitutional: She is oriented to person, place, and time. She appears well-developed and well-nourished. No distress.  HENT:  Head: Normocephalic and atraumatic.    Right Ear: A middle ear effusion is present.  Left Ear: A middle ear effusion is present.  Nose: Left sinus exhibits maxillary sinus tenderness.  Mouth/Throat: Oropharynx is clear and moist and mucous membranes are normal. Normal dentition. No dental abscesses or dental caries.    Eyes: Pupils are equal, round, and reactive to light. Conjunctivae and EOM are normal. Right eye exhibits no discharge. Left eye exhibits no discharge. No scleral icterus.  Neck: Normal range of motion. Neck supple.  Cardiovascular: Normal rate and regular rhythm.  Exam reveals no gallop and no friction rub.   No murmur heard. Pulmonary/Chest: Effort normal and breath sounds normal. No stridor. No respiratory distress. She has no rales.  Abdominal: Soft. She exhibits no distension. There is no tenderness.  Musculoskeletal: She exhibits no edema or tenderness.  Neurological: She is alert and oriented to person, place, and time.  Skin: Skin is warm and dry. No rash noted. She is not diaphoretic. No erythema.  Psychiatric: She has a normal mood and affect.  Vitals reviewed.    ED Treatments / Results  Labs (all labs ordered are listed, but only abnormal results are displayed) Labs Reviewed - No data to display  EKG  EKG Interpretation None       Radiology No results found.  Procedures Procedures (including critical care time)  Medications Ordered in ED Medications - No data to  display   Initial Impression / Assessment and Plan / ED Course  I have reviewed the triage vital signs and the nursing notes.  Pertinent labs & imaging results that were available during my care of the patient were reviewed by me and considered in my medical decision making (see chart for details).     No evidence of dental abscess, dental caries. Presentation is most consistent with left maxillary sinusitis likely from viral source. Patient was instructed to continue her Pen-VK. Also recommended second opinion, by another dentist before she has her tooth extracted. Recommended warm compresses for her sinusitis. Tylenol for pain.  Non focal neuro exam. No recent head trauma. No fever. Doubt meningitis. Doubt intracranial bleed. Doubt IIH. Doubt trigeminal neuralgia. Doubt cavernous sinus thrombosis. No indication for imaging.   The patient is safe for discharge with strict return precautions.  Final Clinical Impressions(s) / ED Diagnoses   Final diagnoses:  Acute non-recurrent maxillary sinusitis   Disposition: Discharge  Condition: Good  I have discussed the results, Dx and Tx plan with the patient who expressed understanding and agree(s) with the plan. Discharge instructions discussed at great length. The patient was given strict return precautions who verbalized understanding of the instructions. No further questions at time of discharge.    New Prescriptions   No medications on file    Follow Up: Voncille Lo, MD [address] [place] [address] [phone]  Schedule an appointment as soon as possible for a visit  As needed      Nira Conn, MD 08/19/16 2149

## 2016-08-19 NOTE — Progress Notes (Signed)
Patient presents to clinic for pregnancy test which is positive. Patient informed of results. Reviewed allergies and medications, recommended starting prenatal vitamins. Advised to start prenatal care asap. Pregnancy verification letter given. Patient also requested a dental clearance letter as her dentist was requesting it. This was also provided. Patient voiced understanding of all recommendations.

## 2016-08-19 NOTE — ED Triage Notes (Signed)
Pt complains of left sided facial pain for the past 2 days. Pt states pain radiates from her left upper mouth to her lower jaw and forehead. Pt was seen by dentist this morning and given prescription for penicillin and tylenol 3. Pt states medication is not helping. Pt is [redacted] weeks pregnant.

## 2016-09-30 ENCOUNTER — Encounter: Payer: Self-pay | Admitting: Advanced Practice Midwife

## 2016-10-08 LAB — OB RESULTS CONSOLE ABO/RH: RH TYPE: POSITIVE

## 2016-10-08 LAB — OB RESULTS CONSOLE ANTIBODY SCREEN: Antibody Screen: NEGATIVE

## 2016-10-08 LAB — OB RESULTS CONSOLE RUBELLA ANTIBODY, IGM: Rubella: NON-IMMUNE/NOT IMMUNE

## 2016-10-08 LAB — OB RESULTS CONSOLE HIV ANTIBODY (ROUTINE TESTING): HIV: NONREACTIVE

## 2016-10-08 LAB — OB RESULTS CONSOLE HEPATITIS B SURFACE ANTIGEN: HEP B S AG: NEGATIVE

## 2016-10-08 LAB — OB RESULTS CONSOLE RPR: RPR: NONREACTIVE

## 2016-11-19 ENCOUNTER — Inpatient Hospital Stay (HOSPITAL_COMMUNITY): Payer: Medicaid Other

## 2016-11-19 ENCOUNTER — Encounter (HOSPITAL_COMMUNITY): Payer: Self-pay | Admitting: *Deleted

## 2016-11-19 ENCOUNTER — Inpatient Hospital Stay (HOSPITAL_COMMUNITY)
Admission: AD | Admit: 2016-11-19 | Discharge: 2016-11-19 | Disposition: A | Discharge status: Home / Self Care | Payer: Medicaid Other | Source: Ambulatory Visit | Attending: Obstetrics & Gynecology | Admitting: Obstetrics & Gynecology

## 2016-11-19 DIAGNOSIS — Z3A18 18 weeks gestation of pregnancy: Secondary | ICD-10-CM | POA: Insufficient documentation

## 2016-11-19 DIAGNOSIS — O26892 Other specified pregnancy related conditions, second trimester: Secondary | ICD-10-CM | POA: Diagnosis present

## 2016-11-19 DIAGNOSIS — O2342 Unspecified infection of urinary tract in pregnancy, second trimester: Secondary | ICD-10-CM | POA: Diagnosis not present

## 2016-11-19 DIAGNOSIS — R109 Unspecified abdominal pain: Secondary | ICD-10-CM | POA: Diagnosis present

## 2016-11-19 HISTORY — DX: Anxiety disorder, unspecified: F41.9

## 2016-11-19 LAB — URINALYSIS, ROUTINE W REFLEX MICROSCOPIC
BACTERIA UA: NONE SEEN
BILIRUBIN URINE: NEGATIVE
GLUCOSE, UA: NEGATIVE mg/dL
Ketones, ur: NEGATIVE mg/dL
NITRITE: POSITIVE — AB
PH: 7 (ref 5.0–8.0)
Protein, ur: NEGATIVE mg/dL
SPECIFIC GRAVITY, URINE: 1.001 — AB (ref 1.005–1.030)

## 2016-11-19 LAB — WET PREP, GENITAL
Clue Cells Wet Prep HPF POC: NONE SEEN
SPERM: NONE SEEN
TRICH WET PREP: NONE SEEN
Yeast Wet Prep HPF POC: NONE SEEN

## 2016-11-19 MED ORDER — CEPHALEXIN 500 MG PO CAPS: 500.0000 mg | ORAL_CAPSULE | Freq: Four times a day (QID) | ORAL | 0 refills | Status: DC

## 2016-11-19 MED ORDER — PHENAZOPYRIDINE HCL 200 MG PO TABS: 200.0000 mg | ORAL_TABLET | Freq: Three times a day (TID) | ORAL | 0 refills | Status: DC

## 2016-11-19 NOTE — MAU Note (Signed)
Pt woke up this a.m., having mild abd pain, went to BR, noted pink blood on toilet tissue.  Pt thinks she has a UTI because she has had dysuria & frequency, is taking AZO.  Sees Dr. Shawnie Ponsorn in [place].

## 2016-11-19 NOTE — MAU Provider Note (Signed)
History     CSN: [id number]  Arrival date and time: 11/19/16 0844   First Provider Initiated Contact with Patient 11/19/16 [phone]      Chief Complaint  Patient presents with  . Abdominal Pain  . Vaginal Bleeding   HPI  Ms. Peggy ConesGriselda Shober is a [age] y.o. G1P0000 at 7043w4d gestation presenting to MAU with complaints of mild abdominal pain, went to the BR and saw pink blood on the tissue.  She states she thinks she has a UTI, because she also has increased frequency and pain with urination. She is currently taking Azo for her sx's.  Past Medical History:  Diagnosis Date  . Anxiety    not diagnosed, no meds; pt reports felt during pregnancy  . Murmur    as a child    History reviewed. No pertinent surgical history.  Family History  Problem Relation Age of Onset  . Diabetes Maternal Grandmother   . Diabetes Maternal Grandfather   . Diabetes Paternal Grandmother   . Diabetes Paternal Grandfather     Social History  Substance Use Topics  . Smoking status: Former Smoker    Types: Cigarettes  . Smokeless tobacco: Never Used  . Alcohol use 0.0 oz/week    Allergies: No Known Allergies  Prescriptions Prior to Admission  Medication Sig Dispense Refill Last Dose  . acetaminophen (TYLENOL) 500 MG tablet Take 1,000 mg by mouth every 6 (six) hours as needed for moderate pain.    unknown  . acetaminophen-codeine (TYLENOL #3) 300-30 MG tablet Take 1 tablet by mouth every 6 (six) hours as needed for moderate pain or severe pain.   08/19/2016 at Unknown time  . BENZACLIN gel Apply topically 2 (two) times daily. To acne on face (Patient not taking: Reported on 11/14/2015) 50 g 11 Completed Course at Unknown time  . cyclobenzaprine (FLEXERIL) 10 MG tablet Take 1 tablet (10 mg total) by mouth 2 (two) times daily as needed for muscle spasms. (Patient not taking: Reported on 11/14/2015) 20 tablet 0 Completed Course at Unknown time  . ibuprofen (ADVIL,MOTRIN) 600 MG tablet Take 1 tablet (600 mg  total) by mouth every 6 (six) hours as needed. (Patient not taking: Reported on 11/14/2015) 30 tablet 0 Completed Course at Unknown time  . naproxen (NAPROSYN) 500 MG tablet Take 1 tablet (500 mg total) by mouth every 12 (twelve) hours as needed. For menstrual cramps (Patient not taking: Reported on 11/14/2015) 30 tablet 6 Completed Course at Unknown time  . norgestrel-ethinyl estradiol (LO/OVRAL,CRYSELLE) 0.3-30 MG-MCG tablet Take 1 tablet by mouth daily. (Patient not taking: Reported on 11/14/2015) 1 Package 11 Completed Course at Unknown time  . penicillin v potassium (VEETID) 500 MG tablet Take 500 mg by mouth 3 (three) times daily.   08/19/2016 at Unknown time  . Vitamin D, Ergocalciferol, (DRISDOL) 50000 units CAPS capsule Take 1 capsule (50,000 Units total) by mouth every 7 (seven) days. (Patient not taking: Reported on 11/14/2015) 12 capsule 0 Completed Course at Unknown time    Review of Systems  Constitutional: Negative.   HENT: Negative.   Eyes: Negative.   Respiratory: Negative.   Cardiovascular: Negative.   Gastrointestinal: Negative.   Endocrine: Negative.   Genitourinary: Positive for dysuria, frequency, hematuria ("saw pink blood on tissue"), pelvic pain (lower RT) and urgency.  Musculoskeletal: Negative.   Skin: Negative.   Allergic/Immunologic: Negative.   Neurological: Negative.   Hematological: Negative.   Psychiatric/Behavioral: Negative.    Physical Exam   Blood pressure 126/74, pulse 94,  temperature 98.7 F (37.1 C), temperature source Oral, resp. rate 18, last menstrual period 07/02/2016.  Physical Exam  Nursing note and vitals reviewed. Constitutional: She is oriented to person, place, and time. She appears well-developed and well-nourished.  HENT:  Head: Normocephalic.  Eyes: Pupils are equal, round, and reactive to light.  Neck: Normal range of motion.  Cardiovascular: Normal rate, regular rhythm and normal heart sounds.   Respiratory: Effort normal and  breath sounds normal.  GI: Soft. Bowel sounds are normal.  Genitourinary:  Genitourinary Comments: Uterus: gravid, S=D, cx: smooth, pink, no lesions, small amt of thick, white vaginal d/c, closed/long/firm, no CMT or friability, (+) RT adnexal tenderness   Musculoskeletal: Normal range of motion.  Neurological: She is alert and oriented to person, place, and time.  Skin: Skin is warm and dry.  Psychiatric: She has a normal mood and affect. Her behavior is normal. Judgment and thought content normal.    MAU Course  Procedures  MDM CCUA UCx Wet Prep GC/CT FHTs by doppler: 152 bpm   Results for orders placed or performed during the hospital encounter of 11/19/16 (from the past 24 hour(s))  Urinalysis, Routine w reflex microscopic     Status: Abnormal   Collection Time: 11/19/16  8:45 AM  Result Value Ref Range   Color, Urine AMBER (A) YELLOW   APPearance CLEAR CLEAR   Specific Gravity, Urine 1.001 (L) 1.005 - 1.030   pH 7.0 5.0 - 8.0   Glucose, UA NEGATIVE NEGATIVE mg/dL   Hgb urine dipstick MODERATE (A) NEGATIVE   Bilirubin Urine NEGATIVE NEGATIVE   Ketones, ur NEGATIVE NEGATIVE mg/dL   Protein, ur NEGATIVE NEGATIVE mg/dL   Nitrite POSITIVE (A) NEGATIVE   Leukocytes, UA SMALL (A) NEGATIVE   RBC / HPF 0-5 0 - 5 RBC/hpf   WBC, UA 0-5 0 - 5 WBC/hpf   Bacteria, UA NONE SEEN NONE SEEN   Squamous Epithelial / LPF 0-5 (A) NONE SEEN  Wet prep, genital     Status: Abnormal   Collection Time: 11/19/16 10:10 AM  Result Value Ref Range   Yeast Wet Prep HPF POC NONE SEEN NONE SEEN   Trich, Wet Prep NONE SEEN NONE SEEN   Clue Cells Wet Prep HPF POC NONE SEEN NONE SEEN   WBC, Wet Prep HPF POC MODERATE (A) NONE SEEN   Sperm NONE SEEN    [place] Mfm Ob Limited  Result Date: 11/19/2016 ----------------------------------------------------------------------  OBSTETRICS REPORT                      (Signed Final 11/19/2016 11:49 am)  ---------------------------------------------------------------------- Patient Info  ID #:       [id number]                          D.O.B.:  Sep 14, 1995 (21 yrs)  Name:       Peggy Torres                Visit Date: 11/19/2016 11:11 am ---------------------------------------------------------------------- Performed By  Performed By:     Raoul Pitch        Ref. Address:     [address]                    [facility]  [address]                                                             [place], [place]                                                             [address]  Attending:        Darlyn Read MD         Secondary Phy.:   MAU Nursing-                                                             MAU/Triage  Referred By:      Dia Sitter                Location:         [facility]                    Daelin Haste CNM ---------------------------------------------------------------------- Orders   #  Description                                 Code   1  [place] MFM OB LIMITED                           [phone]  ----------------------------------------------------------------------   #  Ordered By               Order #        Accession #    Episode #   1  Raelyn Mora           [id number]      [id number]     [id number]  ---------------------------------------------------------------------- Indications   [redacted] weeks gestation of pregnancy                Z3A.18   Abdominal pain in pregnancy                    O99.89  ---------------------------------------------------------------------- OB History  Gravidity:    1 ---------------------------------------------------------------------- Fetal Evaluation  Num Of Fetuses:     1  Fetal Heart         157  Rate(bpm):  Cardiac Activity:   Observed  Presentation:       Breech  Placenta:           Anterior, above cervical os  P. Cord Insertion:  Visualized, central  Amniotic Fluid  AFI FV:      Subjectively within normal limits  Comment:     No definite evidence of SCH, placental, uterine or adnexal              abnormality. ---------------------------------------------------------------------- Gestational Age  LMP:           20w 0d  Date:  07/02/16                 EDD:   04/08/17  Clinical EDD:  18w 4d                                        EDD:   04/18/17  Best:          18w 4d     Det. By:  Clinical EDD             EDD:   04/18/17 ---------------------------------------------------------------------- Cervix Uterus Adnexa  Cervix  Length:           3.61  cm.  Normal appearance by transabdominal scan.  Uterus  No abnormality visualized.  Left Ovary  Within normal limits.  Right Ovary  Not visualized.  Cul De Sac:   No free fluid seen.  Adnexa:       No abnormality visualized. ---------------------------------------------------------------------- Impression  Single living intrauterine pregnancy at 18w 4d.  Breech presentation.  Placenta Anterior, above cervical os.  No sonographic evidence of placenta previa or abruption.  Normal amniotic fluid volume. ---------------------------------------------------------------------- Recommendations  Follow-up ultrasounds as clinically indicated. ----------------------------------------------------------------------                   Darlyn Read, MD Electronically Signed Final Report   11/19/2016 11:49 am ----------------------------------------------------------------------   Assessment and Plan  UTI (urinary tract infection) during pregnancy, second trimester - Rx for Keflex 500 mg PO QID - Information provided on UTI in pregnancy and Keflex  - Advised to follow-up with your doctor, if your symptoms are worsening or not improved by the end of your antibiotic treatment.  * Patient requesting to transfer care to [facility] or [facility] -- list provided of all [facility] facilities and advised pt to have prenatal records transferred prior to scheduling appt  Raelyn Mora, MSN, CNM 11/19/2016, 9:45 AM

## 2016-11-19 NOTE — Progress Notes (Signed)
D/c instructions discussed & given.  Prescriptions discussed.  Ques answered re: transfer of care to closer ob practice.  Pt d/c in stable condition with mother.

## 2016-11-19 NOTE — Discharge Instructions (Signed)
Please follow-up with your doctor, if your symptoms are worsening or not improved by the end of your antibiotic treatment.  You have been provided with contact information for [facility].  Call the office of your choice to get scheduled for transfer care and establish care with this practice.

## 2016-11-19 NOTE — MAU Note (Signed)
Pt states she noticed dysuria this am approx 0730, then noticed blood when she wiped, has not had to wear a pad.  Denies itching.

## 2016-11-20 LAB — CULTURE, OB URINE: SPECIAL REQUESTS: NORMAL

## 2016-11-22 LAB — GC/CHLAMYDIA PROBE AMP (~~LOC~~) NOT AT ARMC
Chlamydia: NEGATIVE
NEISSERIA GONORRHEA: NEGATIVE

## 2016-11-30 ENCOUNTER — Encounter (HOSPITAL_COMMUNITY): Payer: Self-pay | Admitting: *Deleted

## 2016-11-30 ENCOUNTER — Other Ambulatory Visit: Payer: Self-pay

## 2016-11-30 ENCOUNTER — Inpatient Hospital Stay (HOSPITAL_COMMUNITY)
Admission: AD | Admit: 2016-11-30 | Discharge: 2016-11-30 | Disposition: A | Discharge status: Home / Self Care | Payer: Medicaid Other | Source: Ambulatory Visit | Attending: Obstetrics and Gynecology | Admitting: Obstetrics and Gynecology

## 2016-11-30 DIAGNOSIS — Z87891 Personal history of nicotine dependence: Secondary | ICD-10-CM | POA: Diagnosis not present

## 2016-11-30 DIAGNOSIS — Z3A20 20 weeks gestation of pregnancy: Secondary | ICD-10-CM | POA: Diagnosis not present

## 2016-11-30 DIAGNOSIS — R195 Other fecal abnormalities: Secondary | ICD-10-CM

## 2016-11-30 DIAGNOSIS — O26892 Other specified pregnancy related conditions, second trimester: Secondary | ICD-10-CM | POA: Diagnosis not present

## 2016-11-30 DIAGNOSIS — A088 Other specified intestinal infections: Secondary | ICD-10-CM | POA: Insufficient documentation

## 2016-11-30 DIAGNOSIS — R197 Diarrhea, unspecified: Secondary | ICD-10-CM | POA: Diagnosis present

## 2016-11-30 DIAGNOSIS — T3695XA Adverse effect of unspecified systemic antibiotic, initial encounter: Secondary | ICD-10-CM

## 2016-11-30 DIAGNOSIS — K521 Toxic gastroenteritis and colitis: Secondary | ICD-10-CM

## 2016-11-30 HISTORY — DX: Unspecified infectious disease: B99.9

## 2016-11-30 LAB — URINALYSIS, ROUTINE W REFLEX MICROSCOPIC
BILIRUBIN URINE: NEGATIVE
GLUCOSE, UA: NEGATIVE mg/dL
HGB URINE DIPSTICK: NEGATIVE
Ketones, ur: NEGATIVE mg/dL
NITRITE: NEGATIVE
Protein, ur: NEGATIVE mg/dL
SPECIFIC GRAVITY, URINE: 1.01 (ref 1.005–1.030)
pH: 6 (ref 5.0–8.0)

## 2016-11-30 MED ORDER — LOPERAMIDE HCL 2 MG PO TABS: 2.0000 mg | ORAL_TABLET | Freq: Three times a day (TID) | ORAL | 0 refills | Status: DC | PRN

## 2016-11-30 NOTE — MAU Note (Signed)
Urine in lab 

## 2016-11-30 NOTE — Discharge Instructions (Signed)
Diarrea en los adultos (Diarrhea, Adult) La diarrea ocurre cuando la materia fecal (heces) es frecuentemente blanda y [place]. La diarrea puede hacerlo sentir dbil y causarle deshidratacin. La deshidratacin puede hacerlo sentir cansado y sediento, producirle sequedad en la boca y disminuir la frecuencia con la que [place]. La diarrea suele durar 2 o 3das. Sin embargo, puede durar ms tiempo si se trata de un signo de algo ms serio. Es importante tratar la diarrea como se lo haya indicado el mdico. CUIDADOS EN EL Devon EnergyHOGAR Comida y bebida Siga estas recomendaciones como se lo haya indicado el mdico:  Tome una solucin de rehidratacin oral (SRO). Es Neomia Dearuna bebida que se vende en farmacias y tiendas.  Beba lquidos claros, por ejemplo: ? [place]. ? Cubitos de hielo. ? Jugo de frutas diluido. ? Bebidas deportivas de bajas caloras.  En la medida en que pueda, consuma alimentos blandos y fciles de digerir en pequeas cantidades. Ellos son: ? Bananas. ? Pur de Praxairmanzana. ? Arroz. ? Carnes bajas en grasa Nonah Mattes(magras). ? Tostadas. ? Galletas.  Evite beber lquidos que contengan mucha azcar o cafena.  Evite el alcohol.  Evite los alimentos picantes o grasos. Instrucciones generales  Beba suficiente lquido para mantener el pis (orina) claro o de color amarillo plido.  Lvese las manos con frecuencia. Use un desinfectante para manos si no dispone de [place] y [place]jabn.  Asegrese de que todas las personas que viven en su casa se laven bien las manos y con frecuencia.  Tome los medicamentos de venta libre y los recetados solamente como se lo haya indicado el mdico.  Descanse en su casa hasta sentirse mejor.  Controle su afeccin para ver si hay cambios.  Tome un bao con agua tibia para Psychologist, educationalaliviar cualquier ardor o dolor a causa de la diarrea.  Concurra a todas las visitas de control como se lo haya indicado el mdico. Esto es importante. SOLICITE AYUDA SI:  Tiene fiebre.  La diarrea  empeora.  Aparecen nuevos sntomas.  No puede retener los lquidos.  Se siente mareado o siente que va a desvanecerse.  Tiene dolores de Turkmenistancabeza.  Tiene calambres musculares. SOLICITE AYUDA DE INMEDIATO SI:  Siente dolor en el pecho.  Se siente muy dbil o se desvanece (se desmaya).  Tiene heces con sangre, de color negro o con aspecto alquitranado.  Tiene dolor muy intenso en el vientre (abdomen), clicos o meteorismo.  Le cuesta respirar o respira muy rpidamente.  Su corazn late muy rpidamente.  Siente la piel fra y hmeda.  Se siente confundido.  Tiene signos de deshidratacin, por ejemplo: ? La Comor[place]ina es [place]ite Fallsoscura, es muy escasa o no orina. ? Labios agrietados. ? M.D.C. HoldingsBoca seca. ? Ojos hundidos. ? Somnolencia. ? Debilidad. Esta informacin no tiene Theme park managercomo fin reemplazar el consejo del mdico. Asegrese de hacerle al mdico cualquier pregunta que tenga. Document Released: 12/22/2011 Document Revised: 04/28/2015 Document Reviewed: 09/10/2014 Elsevier Interactive Patient Education  2018 [employer] -Medication sent to pharmacy for diarrhea -No longer need antibiotics -Continue to stay hydrated
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
## 2017-01-18 NOTE — L&D Delivery Note (Signed)
Delivery Note At 4:00 PM a viable female was delivered via Vaginal, Spontaneous (Presentation: OA, complex with infant right elbow presenting shortly after head.  APGAR: 8, 8; weight 8 lb 13.3 oz (4005 g).   Placenta status: Spontaneous, intact.  Cord:  3VC with the following complications: .  Cord pH: not collected  Anesthesia:  Epidural and local Episiotomy: None Lacerations: 2nd degree;Labial Suture Repair: 3.0 monocryl Est. Blood Loss (mL): 100  Mom to postpartum.  Baby to Couplet care / Skin to Skin.  Isa RankinKimberly Niles [facility] 04/13/2017, 6:26 PM

## 2017-02-03 LAB — OB RESULTS CONSOLE GBS: STREP GROUP B AG: POSITIVE

## 2017-03-03 ENCOUNTER — Other Ambulatory Visit: Payer: Self-pay

## 2017-03-03 ENCOUNTER — Encounter (HOSPITAL_COMMUNITY): Payer: Self-pay | Admitting: *Deleted

## 2017-03-03 ENCOUNTER — Inpatient Hospital Stay (HOSPITAL_COMMUNITY)
Admission: AD | Admit: 2017-03-03 | Discharge: 2017-03-03 | Disposition: A | Discharge status: Home / Self Care | Payer: Medicaid Other | Source: Ambulatory Visit | Attending: Obstetrics & Gynecology | Admitting: Obstetrics & Gynecology

## 2017-03-03 DIAGNOSIS — O4703 False labor before 37 completed weeks of gestation, third trimester: Secondary | ICD-10-CM | POA: Insufficient documentation

## 2017-03-03 DIAGNOSIS — Z818 Family history of other mental and behavioral disorders: Secondary | ICD-10-CM | POA: Diagnosis not present

## 2017-03-03 DIAGNOSIS — Z87440 Personal history of urinary (tract) infections: Secondary | ICD-10-CM | POA: Insufficient documentation

## 2017-03-03 DIAGNOSIS — R509 Fever, unspecified: Secondary | ICD-10-CM | POA: Diagnosis present

## 2017-03-03 DIAGNOSIS — O2342 Unspecified infection of urinary tract in pregnancy, second trimester: Secondary | ICD-10-CM

## 2017-03-03 DIAGNOSIS — Z87891 Personal history of nicotine dependence: Secondary | ICD-10-CM | POA: Insufficient documentation

## 2017-03-03 DIAGNOSIS — Z3A33 33 weeks gestation of pregnancy: Secondary | ICD-10-CM | POA: Insufficient documentation

## 2017-03-03 LAB — URINALYSIS, ROUTINE W REFLEX MICROSCOPIC
BILIRUBIN URINE: NEGATIVE
Glucose, UA: 50 mg/dL — AB
HGB URINE DIPSTICK: NEGATIVE
Ketones, ur: NEGATIVE mg/dL
LEUKOCYTES UA: NEGATIVE
NITRITE: NEGATIVE
PROTEIN: NEGATIVE mg/dL
SPECIFIC GRAVITY, URINE: 1.004 — AB (ref 1.005–1.030)
pH: 7 (ref 5.0–8.0)

## 2017-03-03 LAB — FETAL FIBRONECTIN: FETAL FIBRONECTIN: NEGATIVE

## 2017-03-03 MED ORDER — NIFEDIPINE 10 MG PO CAPS
10.0000 mg | ORAL_CAPSULE | ORAL | Status: AC | PRN
  Administered 2017-03-03 (×3): 10 mg via ORAL
  Filled 2017-03-03 (×3): qty 1

## 2017-03-03 MED ORDER — LACTATED RINGERS IV SOLN
Freq: Once | INTRAVENOUS | Status: AC
  Administered 2017-03-03: 19:00:00 via INTRAVENOUS

## 2017-03-03 MED ORDER — BETAMETHASONE SOD PHOS & ACET 6 (3-3) MG/ML IJ SUSP
12.0000 mg | Freq: Once | INTRAMUSCULAR | Status: AC
  Administered 2017-03-03: 12 mg via INTRAMUSCULAR
  Filled 2017-03-03: qty 2

## 2017-03-03 NOTE — MAU Provider Note (Signed)
History     CSN: [id number]  Arrival date and time: 03/03/17 1752   First Provider Initiated Contact with Patient 03/03/17 1838      Chief Complaint  Patient presents with  . Fever   HPI Peggy ConesGriselda Torres 22 y.o. 3321w3d  Comes to MAU today with feeling feverish and hot periodically.  Then when arrived started feeling lower abdominal pain in addition to the back pain that she has been feeling through the entire pregnancy.  Did not check her temperature with a thermometer but thought she was having a fever.  OB History    Gravida Para Term Preterm AB Living   1 0 0 0 0 0   SAB TAB Ectopic Multiple Live Births   0 0 0 0 0      Past Medical History:  Diagnosis Date  . Anxiety    not diagnosed, no meds; pt reports felt during pregnancy  . Infection    UTI  . Murmur    as a child    Past Surgical History:  Procedure Laterality Date  . NO PAST SURGERIES      Family History  Problem Relation Age of Onset  . Diabetes Maternal Grandmother   . Diabetes Maternal Grandfather   . Depression Mother     Social History   Tobacco Use  . Smoking status: Former Smoker    Types: Cigarettes  . Smokeless tobacco: Never Used  . Tobacco comment: quit 2016  Substance Use Topics  . Alcohol use: Yes    Alcohol/week: 0.0 oz    Comment: not with preg  . Drug use: No    Allergies: No Known Allergies  Medications Prior to Admission  Medication Sig Dispense Refill Last Dose  . loperamide (IMODIUM A-D) 2 MG tablet Take 1 tablet (2 mg total) 3 (three) times daily as needed by mouth for diarrhea or loose stools. 30 tablet 0   . phenazopyridine (PYRIDIUM) 200 MG tablet Take 1 tablet (200 mg total) by mouth 3 (three) times daily. 6 tablet 0   . Prenatal Multivit-Min-Fe-FA (PRENATAL 1 + IRON PO) Prenatal Vitamin tablet  Take 1 tablet every day by oral route.   11/18/2016 at Unknown time    Review of Systems  Constitutional: Positive for fever.  Gastrointestinal: Positive for abdominal  pain. Negative for constipation, diarrhea, nausea and vomiting.  Genitourinary: Negative for dysuria, vaginal bleeding and vaginal discharge.  Musculoskeletal: Positive for back pain.   Physical Exam   Blood pressure (!) 111/59, pulse 83, temperature 98.6 F (37 C), resp. rate 18, height 5\' 5"  (1.651 m), weight 175 lb (79.4 kg), last menstrual period 07/02/2016.  Physical Exam  Nursing note and vitals reviewed. Constitutional: She is oriented to person, place, and time. She appears well-developed and well-nourished.  HENT:  Head: Normocephalic.  Eyes: EOM are normal.  Neck: Neck supple.  GI: Soft. There is no tenderness. There is no rebound and no guarding.  FHT baseline 145 with 15x15 accels noted.  No decelerations.  Contractions every 5-7 minutes and can be palpated.  Reactive NST  Genitourinary:  Genitourinary Comments: Speculum exam: Vagina - Small amount of white discharge, no odor, no bleeding Cervix - appears closed Bimanual exam: Cervix internal os is closed, vertex at -3, lower uterine segment thinning FFN done Chaperone present for exam.   Musculoskeletal: Normal range of motion.  Neurological: She is alert and oriented to person, place, and time.  Skin: Skin is warm and dry.  Psychiatric: She has a  normal mood and affect.    MAU Course  Procedures Results for orders placed or performed during the hospital encounter of 03/03/17 (from the past 24 hour(s))  Urinalysis, Routine w reflex microscopic     Status: Abnormal   Collection Time: 03/03/17  6:20 PM  Result Value Ref Range   Color, Urine STRAW (A) YELLOW   APPearance CLEAR CLEAR   Specific Gravity, Urine 1.004 (L) 1.005 - 1.030   pH 7.0 5.0 - 8.0   Glucose, UA 50 (A) NEGATIVE mg/dL   Hgb urine dipstick NEGATIVE NEGATIVE   Bilirubin Urine NEGATIVE NEGATIVE   Ketones, ur NEGATIVE NEGATIVE mg/dL   Protein, ur NEGATIVE NEGATIVE mg/dL   Nitrite NEGATIVE NEGATIVE   Leukocytes, UA NEGATIVE NEGATIVE   RBC / HPF  0-5 0 - 5 RBC/hpf   WBC, UA 0-5 0 - 5 WBC/hpf   Bacteria, UA RARE (A) NONE SEEN   Squamous Epithelial / LPF 0-5 (A) NONE SEEN   Mucus PRESENT   Fetal fibronectin     Status: None   Collection Time: 03/03/17  6:43 PM  Result Value Ref Range   Fetal Fibronectin NEGATIVE NEGATIVE    MDM FFN pending.  No UTI.  Will do IVF and Procardia 10 mg PO q 20 min. X 3 doses to stop contractions Care assumed by C. Druscilla Brownie, CNM at 1930  -FFN negative -No cervical change in over 2 hours -Patient not feeling contractions or tightening in her abdomen -Will give BMZ today and tomorrow  Assessment and Plan   1. UTI (urinary tract infection) during pregnancy, second trimester   2. Threatened premature labor in third trimester   3. [redacted] weeks gestation of pregnancy    -Discharge home in stable condition -Preterm labor precautions discussed -Patient advised to follow-up with MAU tomorrow for repeat BMZ -Patient may return to MAU as needed or if her condition were to change or worsen  Rolm Bookbinder, [place] 03/03/17 8:42 PM

## 2017-03-03 NOTE — MAU Note (Signed)
Pt feels like she might have a fever . (started feeling hot about an hour ago.) Stomach started getting hard about 20 min ago. Good fetal movement reported.

## 2017-03-03 NOTE — Discharge Instructions (Signed)

## 2017-03-04 ENCOUNTER — Other Ambulatory Visit: Payer: Self-pay

## 2017-03-04 ENCOUNTER — Inpatient Hospital Stay (HOSPITAL_COMMUNITY)
Admission: AD | Admit: 2017-03-04 | Discharge: 2017-03-04 | Disposition: A | Discharge status: Home / Self Care | Payer: Medicaid Other | Source: Ambulatory Visit | Attending: Obstetrics and Gynecology | Admitting: Obstetrics and Gynecology

## 2017-03-04 DIAGNOSIS — O4703 False labor before 37 completed weeks of gestation, third trimester: Secondary | ICD-10-CM | POA: Diagnosis not present

## 2017-03-04 DIAGNOSIS — O2342 Unspecified infection of urinary tract in pregnancy, second trimester: Secondary | ICD-10-CM

## 2017-03-04 DIAGNOSIS — Z3A33 33 weeks gestation of pregnancy: Secondary | ICD-10-CM | POA: Insufficient documentation

## 2017-03-04 MED ORDER — BETAMETHASONE SOD PHOS & ACET 6 (3-3) MG/ML IJ SUSP
12.0000 mg | Freq: Once | INTRAMUSCULAR | Status: AC
  Administered 2017-03-04: 12 mg via INTRAMUSCULAR
  Filled 2017-03-04: qty 2

## 2017-03-04 NOTE — MAU Note (Signed)
Here for second BMZ. Denies LOF or bleeding. Stomach gets hard sometimes when baby moves. No pain

## 2017-03-04 NOTE — Progress Notes (Signed)
Will wait in lobby 20mins after injection and then may leave. [facility] or return to [facility]U sooner if needed

## 2017-03-11 ENCOUNTER — Other Ambulatory Visit (HOSPITAL_COMMUNITY): Payer: Self-pay | Admitting: Nurse Practitioner

## 2017-03-11 DIAGNOSIS — Z363 Encounter for antenatal screening for malformations: Secondary | ICD-10-CM

## 2017-03-15 ENCOUNTER — Encounter (HOSPITAL_COMMUNITY): Payer: Self-pay

## 2017-03-15 ENCOUNTER — Ambulatory Visit (HOSPITAL_COMMUNITY)
Admission: RE | Admit: 2017-03-15 | Discharge: 2017-03-15 | Disposition: A | Discharge status: Home / Self Care | Payer: Medicaid Other | Source: Ambulatory Visit | Attending: Nurse Practitioner | Admitting: Nurse Practitioner

## 2017-03-15 ENCOUNTER — Other Ambulatory Visit (HOSPITAL_COMMUNITY): Payer: Self-pay | Admitting: Nurse Practitioner

## 2017-03-15 DIAGNOSIS — Z363 Encounter for antenatal screening for malformations: Secondary | ICD-10-CM

## 2017-03-15 DIAGNOSIS — O99210 Obesity complicating pregnancy, unspecified trimester: Secondary | ICD-10-CM

## 2017-03-15 DIAGNOSIS — Z3A35 35 weeks gestation of pregnancy: Secondary | ICD-10-CM

## 2017-03-15 DIAGNOSIS — O99213 Obesity complicating pregnancy, third trimester: Secondary | ICD-10-CM | POA: Insufficient documentation

## 2017-03-15 DIAGNOSIS — O0933 Supervision of pregnancy with insufficient antenatal care, third trimester: Secondary | ICD-10-CM | POA: Diagnosis not present

## 2017-03-15 DIAGNOSIS — O2342 Unspecified infection of urinary tract in pregnancy, second trimester: Secondary | ICD-10-CM

## 2017-03-18 LAB — OB RESULTS CONSOLE GC/CHLAMYDIA
CHLAMYDIA, DNA PROBE: NEGATIVE
Gonorrhea: NEGATIVE

## 2017-04-11 ENCOUNTER — Inpatient Hospital Stay (HOSPITAL_COMMUNITY)
Admission: AD | Admit: 2017-04-11 | Discharge: 2017-04-11 | Disposition: A | Discharge status: Home / Self Care | Payer: Medicaid Other | Source: Ambulatory Visit | Attending: Obstetrics and Gynecology | Admitting: Obstetrics and Gynecology

## 2017-04-11 ENCOUNTER — Encounter (HOSPITAL_COMMUNITY): Payer: Self-pay

## 2017-04-11 DIAGNOSIS — O471 False labor at or after 37 completed weeks of gestation: Secondary | ICD-10-CM | POA: Diagnosis not present

## 2017-04-11 DIAGNOSIS — O368130 Decreased fetal movements, third trimester, not applicable or unspecified: Secondary | ICD-10-CM | POA: Diagnosis not present

## 2017-04-11 DIAGNOSIS — O26893 Other specified pregnancy related conditions, third trimester: Secondary | ICD-10-CM | POA: Diagnosis not present

## 2017-04-11 DIAGNOSIS — Z3A39 39 weeks gestation of pregnancy: Secondary | ICD-10-CM | POA: Diagnosis not present

## 2017-04-11 DIAGNOSIS — N898 Other specified noninflammatory disorders of vagina: Secondary | ICD-10-CM | POA: Insufficient documentation

## 2017-04-11 DIAGNOSIS — O479 False labor, unspecified: Secondary | ICD-10-CM

## 2017-04-11 LAB — WET PREP, GENITAL
Clue Cells Wet Prep HPF POC: NONE SEEN
Trich, Wet Prep: NONE SEEN
Yeast Wet Prep HPF POC: NONE SEEN

## 2017-04-11 LAB — POCT FERN TEST: POCT FERN TEST: NEGATIVE

## 2017-04-11 NOTE — MAU Note (Signed)
Pt reports a lot of white and clear vaginal discharge that she noticed at 3am. States she did have intercourse around 10pm. Pt states she is unsure if water broke. Pt denies vaginal bleeding. Reports good fetal movement. Pt denies pain. Cervix has not been examined.

## 2017-04-11 NOTE — MAU Provider Note (Signed)
S: Ms. Peggy Torres is a 22 y.o. G1P0000 at 3084w0d  who presents to MAU today complaining of "vaginal discharge" since 0300. She denies vaginal bleeding. She denies contractions. She reports normal fetal movement. She reports having intercourse last night around 2200 and is unsure if her water broke.    O: BP 138/76 (BP Location: Right Arm)   Pulse 84   Temp 98 F (36.7 C)   Resp 16   Ht 5\' 5"  (1.651 m)   Wt 190 lb (86.2 kg)   LMP 07/12/2016 (Approximate)   SpO2 100%   BMI 31.62 kg/m  GENERAL: Well-developed, well-nourished female in no acute distress.  HEAD: Normocephalic, atraumatic.  CHEST: Normal effort of breathing, regular heart rate ABDOMEN: Soft, nontender, gravid PELVIC: Normal external female genitalia. Vagina is pink and rugated. Cervix with normal contour, no lesions. White thin discharge present.  Negative pooling.   Cervical exam:  Dilation: Closed Effacement (%): Thick Cervical Position: Posterior Exam by:: Steward DroneVeronica Florette Thai CNM   Fetal Monitoring: Baseline: 135 Variability: moderate Accelerations: present  Decelerations: none Contractions: 2-3/ mild by palpation- pt unaware of contractions   Results for orders placed or performed during the hospital encounter of 04/11/17 (from the past 24 hour(s))  Wet prep, genital     Status: Abnormal   Collection Time: 04/11/17  4:30 AM  Result Value Ref Range   Yeast Wet Prep HPF POC NONE SEEN NONE SEEN   Trich, Wet Prep NONE SEEN NONE SEEN   Clue Cells Wet Prep HPF POC NONE SEEN NONE SEEN   WBC, Wet Prep HPF POC MODERATE (A) NONE SEEN   Sperm PRESENT   Fern Test     Status: None   Collection Time: 04/11/17  4:35 AM  Result Value Ref Range   POCT Fern Test Negative = intact amniotic membranes      A: SIUP at 3584w0d  Membranes intact  P: Discharge home Follow up as scheduled in the office for prenatal appointments   Sharyon CableRogers, Yulia Ulrich C, CNM 04/11/2017 4:58 AM

## 2017-04-12 ENCOUNTER — Encounter (HOSPITAL_COMMUNITY): Payer: Self-pay

## 2017-04-12 ENCOUNTER — Inpatient Hospital Stay (EMERGENCY_DEPARTMENT_HOSPITAL)
Admission: AD | Admit: 2017-04-12 | Discharge: 2017-04-12 | Disposition: A | Discharge status: Home / Self Care | Payer: Medicaid Other | Source: Ambulatory Visit | Attending: Family Medicine | Admitting: Family Medicine

## 2017-04-12 DIAGNOSIS — O2343 Unspecified infection of urinary tract in pregnancy, third trimester: Secondary | ICD-10-CM | POA: Insufficient documentation

## 2017-04-12 DIAGNOSIS — O471 False labor at or after 37 completed weeks of gestation: Secondary | ICD-10-CM

## 2017-04-12 DIAGNOSIS — Z87891 Personal history of nicotine dependence: Secondary | ICD-10-CM

## 2017-04-12 DIAGNOSIS — Z3A39 39 weeks gestation of pregnancy: Secondary | ICD-10-CM

## 2017-04-12 DIAGNOSIS — O4693 Antepartum hemorrhage, unspecified, third trimester: Secondary | ICD-10-CM | POA: Insufficient documentation

## 2017-04-12 DIAGNOSIS — O368130 Decreased fetal movements, third trimester, not applicable or unspecified: Secondary | ICD-10-CM | POA: Diagnosis not present

## 2017-04-12 DIAGNOSIS — O479 False labor, unspecified: Secondary | ICD-10-CM

## 2017-04-12 DIAGNOSIS — O2342 Unspecified infection of urinary tract in pregnancy, second trimester: Secondary | ICD-10-CM

## 2017-04-12 DIAGNOSIS — Z3689 Encounter for other specified antenatal screening: Secondary | ICD-10-CM

## 2017-04-12 NOTE — MAU Note (Signed)
Noticed some mucus blood when showering this morning. No ctx. Decreased FM, only felt 1 movement this morning.

## 2017-04-12 NOTE — MAU Note (Signed)
I have communicated with L Leftwich-Kirby CNM and reviewed vital signs:  Vitals:   04/12/17 0814 04/12/17 0952  BP: 133/79 130/78  Pulse: 92   Resp: 18   Temp: 98.6 F (37 C)     Vaginal exam:  Dilation: 1 Effacement (%): 50 Cervical Position: Posterior Station: -2 Presentation: Vertex Exam by:: leftwich-kirby cnm,   Also reviewed contraction pattern and that non-stress test is reactive.  It has been documented that patient is contracting every 2-6 minutes with no cervical change over 1 hour not indicating active labor.  Patient denies any other complaints.  Based on this report provider has given order for discharge.  A discharge order and diagnosis entered by a provider.   Labor discharge instructions reviewed with patient.

## 2017-04-12 NOTE — MAU Note (Signed)
Urine sent to lab 

## 2017-04-12 NOTE — MAU Provider Note (Signed)
Chief Complaint:  Decreased Fetal Movement and Vaginal Bleeding (bloody show)   First Provider Initiated Contact with Patient 04/12/17 0831      HPI: Peggy Torres is a 22 y.o. G1P0000 pt of [facility] at [redacted]w[redacted]d who presents to maternity admissions reporting vaginal bleeding with mucus this morning and decreased fetal movement today.  She reports she woke up this morning and went to the bathroom. When she wiped, there was pink/light red mixed with mucous. She has not required a pad for the bleeding but continues to see it when she wipes. She has only felt one movement of the baby since waking up this morning which is unusual.  She reports some irregular cramping also, that seems worse today than before.  She has not tried any treatments. Nothing makes her symptoms better or worse. There are no other associated symptoms.  She reports some fetal movement in MAU. She denies LOF, vaginal itching/burning, urinary symptoms, h/a, dizziness, n/v, or fever/chills.    Pt declined need for spanish interpreter today.  HPI  Past Medical History: Past Medical History:  Diagnosis Date  . Anxiety    not diagnosed, no meds; pt reports felt during pregnancy  . Infection    UTI  . Murmur    as a child    Past obstetric history: OB History  Gravida Para Term Preterm AB Living  1 0 0 0 0 0  SAB TAB Ectopic Multiple Live Births  0 0 0 0 0    # Outcome Date GA Lbr Len/2nd Weight Sex Delivery Anes PTL Lv  1 Current             Past Surgical History: Past Surgical History:  Procedure Laterality Date  . NO PAST SURGERIES      Family History: Family History  Problem Relation Age of Onset  . Diabetes Maternal Grandmother   . Diabetes Maternal Grandfather   . Depression Mother     Social History: Social History   Tobacco Use  . Smoking status: Former Smoker    Types: Cigarettes  . Smokeless tobacco: Never Used  . Tobacco comment: quit 2016  Substance Use Topics  . Alcohol use: Yes   Alcohol/week: 0.0 oz    Comment: not with preg  . Drug use: No    Allergies: No Known Allergies  Meds:  Medications Prior to Admission  Medication Sig Dispense Refill Last Dose  . Prenatal Multivit-Min-Fe-FA (PRENATAL 1 + IRON PO) Prenatal Vitamin tablet  Take 1 tablet every day by oral route.   Past Week at Unknown time    ROS:  Review of Systems  Constitutional: Negative for chills, fatigue and fever.  Eyes: Negative for visual disturbance.  Respiratory: Negative for shortness of breath.   Cardiovascular: Negative for chest pain.  Gastrointestinal: Positive for abdominal pain. Negative for nausea and vomiting.  Genitourinary: Positive for vaginal bleeding and vaginal discharge. Negative for difficulty urinating, dysuria, flank pain, pelvic pain and vaginal pain.  Neurological: Negative for dizziness and headaches.  Psychiatric/Behavioral: Negative.      I have reviewed patient's Past Medical Hx, Surgical Hx, Family Hx, Social Hx, medications and allergies.   Physical Exam   Patient Vitals for the past 24 hrs:  BP Temp Temp src Pulse Resp Height Weight  04/12/17 0814 133/79 98.6 F (37 C) Oral 92 18 - -  04/12/17 0811 - - - - - 5\' 5"  (1.651 m) 189 lb (85.7 kg)   Constitutional: Well-developed, well-nourished female in no acute distress.  Cardiovascular: normal rate Respiratory: normal effort GI: Abd soft, non-tender, gravid appropriate for gestational age.  MS: Extremities nontender, no edema, normal ROM Neurologic: Alert and oriented x 4.  GU: Neg CVAT.  Dilation: 1 Effacement (%): 50 Cervical Position: Posterior Station: -2 Presentation: Vertex Exam by:: leftwich-kirby cnm  FHT:  Baseline 145 , moderate variability, accelerations present, no decelerations Contractions: q 2-4 mins, mild to palpation   Labs: No results found for this or any previous visit (from the past 24 hour(s)).   Blood type is O positive per prenatal record  Imaging:    MAU  Course/MDM: I have ordered labs and reviewed results.  NST reviewed and reactive Pt feeling normal fetal movement in MAU Vaginal bleeding is minimal on exam, dark brown with mucus only, c/w bloody show, especially in the context of cervical change. She is 1/50/-2 today and was closed and thick on exam in MAU yesterday. Note for pt to miss school to rest today Pt discharge with strict bleeding and labor precautions.  Assessment: 1. Threatened labor at term   2. UTI (urinary tract infection) during pregnancy, second trimester   3. NST (non-stress test) reactive   4. Vaginal bleeding in pregnancy, third trimester     Plan: Discharge home Labor precautions and fetal kick counts  Follow-up Information    [facility]   Why:  As scheduled, return to MAU as needed for signs of labor or emergencies. Contact information: [address] [place] [address] [phone]          Allergies as of 04/12/2017   No Known Allergies     Medication List    TAKE these medications   PRENATAL 1 + IRON PO Prenatal Vitamin tablet  Take 1 tablet every day by oral route.       Sharen CounterLisa Leftwich-Kirby Certified Nurse-Midwife 04/12/2017 9:47 AM

## 2017-04-12 NOTE — Discharge Instructions (Signed)

## 2017-04-13 ENCOUNTER — Inpatient Hospital Stay (HOSPITAL_COMMUNITY): Payer: Medicaid Other | Admitting: Anesthesiology

## 2017-04-13 ENCOUNTER — Encounter (HOSPITAL_COMMUNITY): Payer: Self-pay

## 2017-04-13 ENCOUNTER — Inpatient Hospital Stay (HOSPITAL_COMMUNITY)
Admission: AD | Admit: 2017-04-13 | Discharge: 2017-04-15 | DRG: 807 | Disposition: A | Discharge status: Home / Self Care | Payer: Medicaid Other | Source: Ambulatory Visit | Attending: Family Medicine | Admitting: Family Medicine

## 2017-04-13 DIAGNOSIS — O99824 Streptococcus B carrier state complicating childbirth: Secondary | ICD-10-CM | POA: Diagnosis present

## 2017-04-13 DIAGNOSIS — Z87891 Personal history of nicotine dependence: Secondary | ICD-10-CM

## 2017-04-13 DIAGNOSIS — Z3A39 39 weeks gestation of pregnancy: Secondary | ICD-10-CM | POA: Diagnosis not present

## 2017-04-13 DIAGNOSIS — O1404 Mild to moderate pre-eclampsia, complicating childbirth: Principal | ICD-10-CM | POA: Diagnosis present

## 2017-04-13 DIAGNOSIS — O1493 Unspecified pre-eclampsia, third trimester: Secondary | ICD-10-CM

## 2017-04-13 DIAGNOSIS — O2342 Unspecified infection of urinary tract in pregnancy, second trimester: Secondary | ICD-10-CM

## 2017-04-13 LAB — PROTEIN / CREATININE RATIO, URINE
Creatinine, Urine: 43 mg/dL
PROTEIN CREATININE RATIO: 0.47 mg/mg{creat} — AB (ref 0.00–0.15)
Total Protein, Urine: 20 mg/dL

## 2017-04-13 LAB — CBC
HCT: 39.3 % (ref 36.0–46.0)
HCT: 36.7 % (ref 36.0–46.0)
HCT: 35.5 % — ABNORMAL LOW (ref 36.0–46.0)
Hemoglobin: 13.1 g/dL (ref 12.0–15.0)
Hemoglobin: 12.4 g/dL (ref 12.0–15.0)
Hemoglobin: 11.8 g/dL — ABNORMAL LOW (ref 12.0–15.0)
MCH: 27.8 pg (ref 26.0–34.0)
MCH: 28 pg (ref 26.0–34.0)
MCH: 27.8 pg (ref 26.0–34.0)
MCHC: 33.3 g/dL (ref 30.0–36.0)
MCHC: 33.8 g/dL (ref 30.0–36.0)
MCHC: 33.2 g/dL (ref 30.0–36.0)
MCV: 83.3 fL (ref 78.0–100.0)
MCV: 82.8 fL (ref 78.0–100.0)
MCV: 83.7 fL (ref 78.0–100.0)
PLATELETS: 291 10*3/uL (ref 150–400)
PLATELETS: 258 10*3/uL (ref 150–400)
PLATELETS: 229 10*3/uL (ref 150–400)
RBC: 4.72 MIL/uL (ref 3.87–5.11)
RBC: 4.43 MIL/uL (ref 3.87–5.11)
RBC: 4.24 MIL/uL (ref 3.87–5.11)
RDW: 15.4 % (ref 11.5–15.5)
RDW: 15.1 % (ref 11.5–15.5)
RDW: 15.3 % (ref 11.5–15.5)
WBC: 12.8 10*3/uL — ABNORMAL HIGH (ref 4.0–10.5)
WBC: 17 10*3/uL — AB (ref 4.0–10.5)
WBC: 19.1 10*3/uL — AB (ref 4.0–10.5)

## 2017-04-13 LAB — URINALYSIS, ROUTINE W REFLEX MICROSCOPIC
BILIRUBIN URINE: NEGATIVE
Glucose, UA: NEGATIVE mg/dL
Hgb urine dipstick: NEGATIVE
KETONES UR: NEGATIVE mg/dL
LEUKOCYTES UA: NEGATIVE
Nitrite: NEGATIVE
PH: 7 (ref 5.0–8.0)
PROTEIN: NEGATIVE mg/dL
Specific Gravity, Urine: 1.011 (ref 1.005–1.030)

## 2017-04-13 LAB — COMPREHENSIVE METABOLIC PANEL
ALBUMIN: 3.4 g/dL — AB (ref 3.5–5.0)
ALT: 23 U/L (ref 14–54)
ANION GAP: 10 (ref 5–15)
AST: 23 U/L (ref 15–41)
Alkaline Phosphatase: 208 U/L — ABNORMAL HIGH (ref 38–126)
BUN: 10 mg/dL (ref 6–20)
CALCIUM: 9.2 mg/dL (ref 8.9–10.3)
CHLORIDE: 108 mmol/L (ref 101–111)
CO2: 17 mmol/L — AB (ref 22–32)
Creatinine, Ser: 0.57 mg/dL (ref 0.44–1.00)
GFR calc non Af Amer: >60 mL/min (ref >60)
GLUCOSE: 104 mg/dL — AB (ref 65–99)
Potassium: 4 mmol/L (ref 3.5–5.1)
SODIUM: 135 mmol/L (ref 135–145)
Total Bilirubin: 0.4 mg/dL (ref 0.3–1.2)
Total Protein: 7 g/dL (ref 6.5–8.1)

## 2017-04-13 LAB — ABO/RH: ABO/RH(D): O POS

## 2017-04-13 LAB — RPR: RPR: NONREACTIVE

## 2017-04-13 LAB — TYPE AND SCREEN
ABO/RH(D): O POS
Antibody Screen: NEGATIVE

## 2017-04-13 MED ORDER — HYDRALAZINE HCL 20 MG/ML IJ SOLN: 10.0000 mg | Freq: Once | INTRAMUSCULAR | Status: DC | PRN

## 2017-04-13 MED ORDER — TERBUTALINE SULFATE 1 MG/ML IJ SOLN
0.2500 mg | Freq: Once | INTRAMUSCULAR | Status: DC | PRN
  Filled 2017-04-13: qty 1

## 2017-04-13 MED ORDER — SIMETHICONE 80 MG PO CHEW: 80.0000 mg | CHEWABLE_TABLET | ORAL | Status: DC | PRN

## 2017-04-13 MED ORDER — PHENYLEPHRINE 40 MCG/ML (10ML) SYRINGE FOR IV PUSH (FOR BLOOD PRESSURE SUPPORT)
80.0000 ug | PREFILLED_SYRINGE | INTRAVENOUS | Status: DC | PRN
  Filled 2017-04-13: qty 5

## 2017-04-13 MED ORDER — DIPHENHYDRAMINE HCL 50 MG/ML IJ SOLN: 12.5000 mg | INTRAMUSCULAR | Status: DC | PRN

## 2017-04-13 MED ORDER — BENZOCAINE-MENTHOL 20-0.5 % EX AERO
1.0000 "application " | INHALATION_SPRAY | CUTANEOUS | Status: DC | PRN
  Administered 2017-04-13: 1 via TOPICAL
  Filled 2017-04-13 (×2): qty 56

## 2017-04-13 MED ORDER — SENNOSIDES-DOCUSATE SODIUM 8.6-50 MG PO TABS
2.0000 | ORAL_TABLET | ORAL | Status: DC
  Administered 2017-04-13 – 2017-04-14 (×2): 2 via ORAL
  Filled 2017-04-13 (×2): qty 2

## 2017-04-13 MED ORDER — LACTATED RINGERS IV SOLN: 500.0000 mL | Freq: Once | INTRAVENOUS | Status: DC

## 2017-04-13 MED ORDER — ACETAMINOPHEN 325 MG PO TABS
650.0000 mg | ORAL_TABLET | ORAL | Status: DC | PRN
  Administered 2017-04-14: 650 mg via ORAL
  Filled 2017-04-13 (×2): qty 2

## 2017-04-13 MED ORDER — ONDANSETRON HCL 4 MG/2ML IJ SOLN: 4.0000 mg | Freq: Four times a day (QID) | INTRAMUSCULAR | Status: DC | PRN

## 2017-04-13 MED ORDER — OXYTOCIN BOLUS FROM INFUSION
500.0000 mL | Freq: Once | INTRAVENOUS | Status: AC
  Administered 2017-04-13: 500 mL via INTRAVENOUS

## 2017-04-13 MED ORDER — EPHEDRINE 5 MG/ML INJ
10.0000 mg | INTRAVENOUS | Status: DC | PRN
  Filled 2017-04-13: qty 2

## 2017-04-13 MED ORDER — SOD CITRATE-CITRIC ACID 500-334 MG/5ML PO SOLN: 30.0000 mL | ORAL | Status: DC | PRN

## 2017-04-13 MED ORDER — COCONUT OIL OIL: 1.0000 "application " | TOPICAL_OIL | Status: DC | PRN

## 2017-04-13 MED ORDER — IBUPROFEN 600 MG PO TABS
600.0000 mg | ORAL_TABLET | Freq: Four times a day (QID) | ORAL | Status: DC
  Administered 2017-04-13 – 2017-04-15 (×7): 600 mg via ORAL
  Filled 2017-04-13 (×7): qty 1

## 2017-04-13 MED ORDER — SODIUM CHLORIDE 0.9 % IV SOLN
5.0000 10*6.[IU] | Freq: Once | INTRAVENOUS | Status: AC
  Administered 2017-04-13: 5 10*6.[IU] via INTRAVENOUS
  Filled 2017-04-13: qty 5

## 2017-04-13 MED ORDER — ONDANSETRON HCL 4 MG/2ML IJ SOLN: 4.0000 mg | INTRAMUSCULAR | Status: DC | PRN

## 2017-04-13 MED ORDER — ACETAMINOPHEN 325 MG PO TABS
650.0000 mg | ORAL_TABLET | ORAL | Status: DC | PRN
  Administered 2017-04-13: 650 mg via ORAL

## 2017-04-13 MED ORDER — LIDOCAINE HCL (PF) 1 % IJ SOLN
INTRAMUSCULAR | Status: DC | PRN
  Administered 2017-04-13 (×2): 4 mL

## 2017-04-13 MED ORDER — DIPHENHYDRAMINE HCL 25 MG PO CAPS: 25.0000 mg | ORAL_CAPSULE | Freq: Four times a day (QID) | ORAL | Status: DC | PRN

## 2017-04-13 MED ORDER — FLEET ENEMA 7-19 GM/118ML RE ENEM: 1.0000 | ENEMA | RECTAL | Status: DC | PRN

## 2017-04-13 MED ORDER — FENTANYL CITRATE (PF) 100 MCG/2ML IJ SOLN
50.0000 ug | INTRAMUSCULAR | Status: DC | PRN
  Administered 2017-04-13 (×5): 100 ug via INTRAVENOUS
  Filled 2017-04-13 (×5): qty 2

## 2017-04-13 MED ORDER — FENTANYL 2.5 MCG/ML BUPIVACAINE 1/10 % EPIDURAL INFUSION (WH - ANES): 14.0000 mL/h | INTRAMUSCULAR | Status: DC | PRN

## 2017-04-13 MED ORDER — PENICILLIN G POT IN DEXTROSE 60000 UNIT/ML IV SOLN
3.0000 10*6.[IU] | INTRAVENOUS | Status: DC
  Administered 2017-04-13 (×2): 3 10*6.[IU] via INTRAVENOUS
  Filled 2017-04-13 (×8): qty 50

## 2017-04-13 MED ORDER — HYDROCODONE-ACETAMINOPHEN 5-325 MG PO TABS
1.0000 | ORAL_TABLET | Freq: Once | ORAL | Status: AC
  Administered 2017-04-13: 1 via ORAL
  Filled 2017-04-13: qty 1

## 2017-04-13 MED ORDER — OXYTOCIN 40 UNITS IN LACTATED RINGERS INFUSION - SIMPLE MED
1.0000 m[IU]/min | INTRAVENOUS | Status: DC
  Administered 2017-04-13: 2 m[IU]/min via INTRAVENOUS

## 2017-04-13 MED ORDER — LABETALOL HCL 5 MG/ML IV SOLN: 20.0000 mg | INTRAVENOUS | Status: DC | PRN

## 2017-04-13 MED ORDER — OXYCODONE-ACETAMINOPHEN 5-325 MG PO TABS: 2.0000 | ORAL_TABLET | ORAL | Status: DC | PRN

## 2017-04-13 MED ORDER — WITCH HAZEL-GLYCERIN EX PADS
1.0000 "application " | MEDICATED_PAD | CUTANEOUS | Status: DC | PRN
  Administered 2017-04-14: 1 via TOPICAL

## 2017-04-13 MED ORDER — LIDOCAINE HCL (PF) 1 % IJ SOLN
30.0000 mL | INTRAMUSCULAR | Status: AC | PRN
  Administered 2017-04-13: 30 mL via SUBCUTANEOUS
  Filled 2017-04-13: qty 30

## 2017-04-13 MED ORDER — FENTANYL 2.5 MCG/ML BUPIVACAINE 1/10 % EPIDURAL INFUSION (WH - ANES)
14.0000 mL/h | INTRAMUSCULAR | Status: DC | PRN
  Administered 2017-04-13: 14 mL/h via EPIDURAL
  Filled 2017-04-13: qty 100

## 2017-04-13 MED ORDER — OXYCODONE-ACETAMINOPHEN 5-325 MG PO TABS: 1.0000 | ORAL_TABLET | ORAL | Status: DC | PRN

## 2017-04-13 MED ORDER — LACTATED RINGERS IV SOLN
500.0000 mL | INTRAVENOUS | Status: DC | PRN
  Administered 2017-04-13: 500 mL via INTRAVENOUS

## 2017-04-13 MED ORDER — TETANUS-DIPHTH-ACELL PERTUSSIS 5-2.5-18.5 LF-MCG/0.5 IM SUSP: 0.5000 mL | Freq: Once | INTRAMUSCULAR | Status: DC

## 2017-04-13 MED ORDER — LACTATED RINGERS IV SOLN: INTRAVENOUS | Status: DC

## 2017-04-13 MED ORDER — ONDANSETRON HCL 4 MG PO TABS: 4.0000 mg | ORAL_TABLET | ORAL | Status: DC | PRN

## 2017-04-13 MED ORDER — OXYTOCIN 40 UNITS IN LACTATED RINGERS INFUSION - SIMPLE MED
2.5000 [IU]/h | INTRAVENOUS | Status: DC
  Filled 2017-04-13: qty 1000

## 2017-04-13 MED ORDER — ZOLPIDEM TARTRATE 5 MG PO TABS
5.0000 mg | ORAL_TABLET | Freq: Every evening | ORAL | Status: DC | PRN
Start: 2017-04-13 — End: 2017-04-15

## 2017-04-13 MED ORDER — PHENYLEPHRINE 40 MCG/ML (10ML) SYRINGE FOR IV PUSH (FOR BLOOD PRESSURE SUPPORT)
80.0000 ug | PREFILLED_SYRINGE | INTRAVENOUS | Status: DC | PRN
  Filled 2017-04-13: qty 5
  Filled 2017-04-13 (×2): qty 10

## 2017-04-13 MED ORDER — DIBUCAINE 1 % RE OINT: 1.0000 "application " | TOPICAL_OINTMENT | RECTAL | Status: DC | PRN

## 2017-04-13 MED ORDER — HYDROXYZINE HCL 50 MG PO TABS
50.0000 mg | ORAL_TABLET | Freq: Four times a day (QID) | ORAL | Status: DC | PRN
Start: 2017-04-13 — End: 2017-04-15
  Filled 2017-04-13: qty 1

## 2017-04-13 MED ORDER — PRENATAL MULTIVITAMIN CH
1.0000 | ORAL_TABLET | Freq: Every day | ORAL | Status: DC
  Administered 2017-04-14 – 2017-04-15 (×2): 1 via ORAL
  Filled 2017-04-13 (×2): qty 1

## 2017-04-13 NOTE — Progress Notes (Signed)
Patient ID: Peggy Torres Torres, female   DOB: March 23, 1995, 22 y.o.   MRN: [id number] Peggy Torres is a 22 y.o. G1P0000 at 1073w2d admitted for early labor, pre-e w/o severe fx  Subjective: Uncomfortable w/ uc's. Denies ha, visual changes, ruq/epigastric pain, n/v.    Objective: BP 127/67   Pulse 90   Temp 98.7 F (37.1 C) (Oral)   Resp 18   Ht 5\' 5"  (1.651 m)   Wt 86.2 kg (190 lb)   LMP 07/12/2016 (Approximate)   SpO2 98%   BMI 31.62 kg/m  Torres intake/output data recorded.  FHT:  FHR: 145 bpm, variability: minimal ,  accelerations:  Present,  decelerations:  Absent UC:   q 1-36mins  SVE:   Dilation: 3 Effacement (%): 80 Station: -1 Exam by:: Marius Ditchaniell Wade, RN  Labs: Lab Results  Component Value Date   WBC 12.8 (H) 04/13/2017   HGB 13.1 04/13/2017   HCT 39.3 04/13/2017   MCV 83.3 04/13/2017   PLT 291 04/13/2017    Assessment / Plan: Spontaneous labor, progressing normally, bp's have normalized, asymptomatic.   Labor: progressing normally Fetal Wellbeing:  Category II Pain Control:  IV pain meds Pre-eclampsia: bp's stable I/D:  PCN for GBS+ Anticipated MOD:  NSVD  Cheral MarkerKimberly R Aerilyn Slee CNM, WHNP-BC 04/13/2017, 6:53 AM

## 2017-04-13 NOTE — MAU Note (Signed)
Pt here with c/o contractions and some spotting today. Was seen here earlier today and was 1.5cm. Reports good fetal movement.

## 2017-04-13 NOTE — Progress Notes (Signed)
Labor Progress Note Peggy ConesGriselda Torres is a 22 y.o. G1P0000 at 470w2d presented for early labor, pre-e without severe features.  S: Patient states that she is feeling the contractions but does not think she is close to pushing. She denies rupture of membranes. She was offered an epidural but states she is not ready for it. Does not want AROM at this time.   O:  BP 124/72   Pulse 87   Temp 98.7 F (37.1 C) (Oral)   Resp (!) 22   Ht 5\' 5"  (1.651 m)   Wt 86.2 kg (190 lb)   LMP 07/12/2016 (Approximate)   SpO2 98%   BMI 31.62 kg/m  FHR: 145 bpm, variability: moderate,  accelerations:  Present,  decelerations:  None UC:   q 2-264mins  CVE: Dilation: 4 Effacement (%): 100 Station: -1 Presentation: Vertex Exam by:: Foye ClockS. Oklesh RN   A&P: 22 y.o. G1P0000 5170w2d presented for early labor, pre-e without severe features.  #Labor: Progressing well.  #Pain: Denies epidural at this time #FWB: Cat I #GBS positive Pre-e  Tammara Massing, Student-PA 10:56 AM

## 2017-04-13 NOTE — Lactation Note (Signed)
This note was copied from a baby's chart. Lactation Consultation Note  Patient Name: Peggy Torres HQION'GToday's Date: 04/13/2017 Reason for consult: Initial assessment;1st time breastfeeding;Primapara;Term  5 hours old female who is still being exclusively BF by her mother, she's a P1. She also chose to formula feed, that was her feeding choice upon admission. Baby was already nursing in cross cradle hold when entering the room, he had a good latch but lose the depth as he was falling asleep. Baby was over bundled, mom had clothes and blanket on while at the breast. Explained to mom to importance of STS but she didn't acknowledge any of LC suggestions. Asked mom if she understood what she was asked to do and she verbalized understanding but did not follow any interventions suggested.  Corrected positioning to give baby more depth bringing him closer to the breast, did some breast massage and breast compressions also, but baby fell asleep shortly after. Per mom feedings at the breast are comfortable, nipples looked intact upon examination.   Encouraged mom to feed baby on cues STS 8-12 times/24 hours. Reviewed BF brochure, BF resources and feeding diary, mom is aware of LC services and will call PRN.  Maternal Data Formula Feeding for Exclusion: No Has patient been taught Hand Expression?: Yes Does the patient have breastfeeding experience prior to this delivery?: No  Feeding Feeding Type: Breast Fed Length of feed: 5 min(baby fell asleep, he was too warm (blanket, clothes))  LATCH Score Latch: Grasps breast easily, tongue down, lips flanged, rhythmical sucking.  Audible Swallowing: None  Type of Nipple: Everted at rest and after stimulation  Comfort (Breast/Nipple): Soft / non-tender  Hold (Positioning): Assistance needed to correctly position infant at breast and maintain latch.  LATCH Score: 7  Interventions Interventions: Breast feeding basics reviewed;Assisted with latch;Breast  massage;Breast compression;Adjust position  Lactation Tools Discussed/Used WIC Program: No   Consult Status Consult Status: Follow-up Date: 04/14/17 Follow-up type: In-patient    Anjana Cheek Venetia ConstableS Sayer Masini 04/13/2017, 9:34 PM

## 2017-04-13 NOTE — Progress Notes (Signed)
LABOR PROGRESS NOTE  Peggy Torres is a 22 y.o. G1P0000 at 2158w2d  admitted for preeclampsia w/o severe features- admitted in early labor   Subjective: Patient reports contractions have slowed down. Pt comfortable not reporting pain.   Objective: BP 126/80   Pulse 77   Temp 98.5 F (36.9 C)   Resp 18   Ht 5\' 5"  (1.651 m)   Wt 190 lb (86.2 kg)   LMP 07/12/2016 (Approximate)   SpO2 99%   BMI 31.62 kg/m  or  Vitals:   04/13/17 1215 04/13/17 1216 04/13/17 1220 04/13/17 1221  BP:  134/69  126/80  Pulse:  78  77  Resp:  18  18  Temp:      TempSrc:      SpO2: 99%  99%   Weight:      Height:        Pitocin ordered  Dilation: 4 Effacement (%): 100 Station: -1 Presentation: Vertex Exam by:: Sao [place] PrincipeVeronica CNM FHT: baseline rate 145, moderate varibility, +acel, no decel Toco: 2-5/ mild- moderat  Labs: Lab Results  Component Value Date   WBC 17.0 (H) 04/13/2017   HGB 12.4 04/13/2017   HCT 36.7 04/13/2017   MCV 82.8 04/13/2017   PLT 258 04/13/2017    Patient Active Problem List   Diagnosis Date Noted  . Preeclampsia, third trimester 04/13/2017  . UTI (urinary tract infection) during pregnancy, second trimester 11/19/2016  . Other fatigue 06/24/2015  . Easy bruising 06/24/2015  . Wears glasses 05/25/2013  . Dysmenorrhea 05/25/2013  . Acne vulgaris 05/25/2013    Assessment / Plan: 22 y.o. G1P0000 at 4058w2d here for preeclampsia w/o severe features- admitted in early labor   Labor: Slow progress, pitocin augmentation ordered  Fetal Wellbeing:  Cat I Pain Control:  Epidural  Anticipated MOD:  SVD  Sharyon CableRogers, [name], CNM 04/13/2017, 1:12 PM

## 2017-04-13 NOTE — Anesthesia Preprocedure Evaluation (Signed)
Anesthesia Evaluation  Patient identified by MRN, date of birth, ID band Patient awake    Reviewed: Allergy & Precautions, NPO status , Patient's Chart, lab work & pertinent test results  Airway Mallampati: II  TM Distance: >3 FB Neck ROM: Full    Dental no notable dental hx.    Pulmonary neg pulmonary ROS, former smoker,    Pulmonary exam normal breath sounds clear to auscultation       Cardiovascular hypertension, Normal cardiovascular exam Rhythm:Regular Rate:Normal     Neuro/Psych negative neurological ROS  negative psych ROS   GI/Hepatic negative GI ROS, Neg liver ROS,   Endo/Other  negative endocrine ROS  Renal/GU negative Renal ROS     Musculoskeletal negative musculoskeletal ROS (+)   Abdominal   Peds  Hematology negative hematology ROS (+)   Anesthesia Other Findings   Reproductive/Obstetrics (+) Pregnancy                             Anesthesia Physical Anesthesia Plan  ASA: II  Anesthesia Plan: Epidural   Post-op Pain Management:    Induction:   PONV Risk Score and Plan:   Airway Management Planned:   Additional Equipment:   Intra-op Plan:   Post-operative Plan:   Informed Consent: I have reviewed the patients History and Physical, chart, labs and discussed the procedure including the risks, benefits and alternatives for the proposed anesthesia with the patient or authorized representative who has indicated his/her understanding and acceptance.     Plan Discussed with:   Anesthesia Plan Comments:         Anesthesia Quick Evaluation

## 2017-04-13 NOTE — Anesthesia Pain Management Evaluation Note (Signed)
  CRNA Pain Management Visit Note  Patient: Peggy ConesGriselda Osterman, 22 y.o., female  "Hello I am a [facility] of the anesthesia team [facility]. We have an anesthesia team available at all times to provide care throughout the hospital, including epidural management and anesthesia for C-section. I don't know your plan for the delivery whether it a natural birth, water birth, IV sedation, nitrous supplementation, doula or epidural, but we want to meet your pain goals."   1.Was your pain managed to your expectations on prior hospitalizations?   No prior hospitalizations  2.What is your expectation for pain management during this hospitalization?     Epidural  3.How can we help you reach that goal? Epidural after OB/GYN sees her.  Record the patient's initial score and the patient's pain goal.   Pain: 8  Pain Goal: 2 [facility] wants you to be able to say your pain was always managed very well.  Ronit Cranfield 04/13/2017

## 2017-04-13 NOTE — Anesthesia Procedure Notes (Deleted)
Epidural Patient location during procedure: OB Start time: 04/13/2017 11:21 AM End time: 04/13/2017 11:30 AM  Staffing Anesthesiologist: Lewie LoronGermeroth, Kaiyana Bedore, MD Performed: anesthesiologist   Preanesthetic Checklist Completed: patient identified, pre-op evaluation, timeout performed, IV checked, risks and benefits discussed and monitors and equipment checked  Epidural Patient position: sitting Prep: site prepped and draped and DuraPrep Patient monitoring: heart rate, continuous pulse ox and blood pressure Approach: midline Location: L3-L4 Injection technique: LOR air and LOR saline  Needle:  Needle type: Tuohy  Needle gauge: 17 G Needle length: 9 cm Needle insertion depth: 9 cm Catheter type: closed end flexible Catheter size: 19 Gauge Catheter at skin depth: 14 cm Test dose: negative  Assessment Sensory level: T8 Events: blood not aspirated, injection not painful, no injection resistance, negative IV test and no paresthesia  Additional Notes Reason for block:procedure for pain

## 2017-04-13 NOTE — Anesthesia Procedure Notes (Signed)
Epidural Patient location during procedure: OB Start time: 04/13/2017 11:21 AM End time: 04/13/2017 11:30 AM  Staffing Anesthesiologist: Amelda Hapke, MD Performed: anesthesiologist   Preanesthetic Checklist Completed: patient identified, pre-op evaluation, timeout performed, IV checked, risks and benefits discussed and monitors and equipment checked  Epidural Patient position: sitting Prep: site prepped and draped and DuraPrep Patient monitoring: heart rate, continuous pulse ox and blood pressure Approach: midline Location: L3-L4 Injection technique: LOR air and LOR saline  Needle:  Needle type: Tuohy  Needle gauge: 17 G Needle length: 9 cm Needle insertion depth: 9 cm Catheter type: closed end flexible Catheter size: 19 Gauge Catheter at skin depth: 14 cm Test dose: negative  Assessment Sensory level: T8 Events: blood not aspirated, injection not painful, no injection resistance, negative IV test and no paresthesia  Additional Notes Reason for block:procedure for pain     

## 2017-04-13 NOTE — H&P (Signed)
Peggy Torres is a 22 y.o. G1P0000 female at [redacted]w[redacted]d by LMP presenting for labor check, however was noted to have a couple of elevated bp's and P:C ratio of 0.47. Reports she had slightly elevated bp at last 2 visits as well. Denies ha, visual changes, ruq/epigastric pain, n/v.     Reports active fetal movement, contractions: regular, vaginal bleeding: spotting, membranes: intact. Initiated prenatal care at [facility] at 29 wks after transferring care from Dr. Shawnie Pons.    This pregnancy complicated by: GBS bacteruria   Prenatal History/Complications:  primigravida  Past Medical History: Past Medical History:  Diagnosis Date  . Anxiety    not diagnosed, no meds; pt reports felt during pregnancy  . Infection    UTI  . Murmur    as a child    Past Surgical History: Past Surgical History:  Procedure Laterality Date  . NO PAST SURGERIES      Obstetrical History: OB History    Gravida  1   Para  0   Term  0   Preterm  0   AB  0   Living  0     SAB  0   TAB  0   Ectopic  0   Multiple  0   Live Births  0           Social History: Social History   Socioeconomic History  . Marital status: Single    Spouse name: Not on file  . Number of children: Not on file  . Years of education: Not on file  . Highest education level: Not on file  Occupational History  . Not on file  Social Needs  . Financial resource strain: Not on file  . Food insecurity:    Worry: Not on file    Inability: Not on file  . Transportation needs:    Medical: Not on file    Non-medical: Not on file  Tobacco Use  . Smoking status: Former Smoker    Types: Cigarettes  . Smokeless tobacco: Never Used  . Tobacco comment: quit 2016  Substance and Sexual Activity  . Alcohol use: Yes    Alcohol/week: 0.0 oz    Comment: not with preg  . Drug use: No  . Sexual activity: Yes    Birth control/protection: None  Lifestyle  . Physical activity:    Days per week: Not on file    Minutes per  session: Not on file  . Stress: Not on file  Relationships  . Social connections:    Talks on phone: Not on file    Gets together: Not on file    Attends religious service: Not on file    Active member of club or organization: Not on file    Attends meetings of clubs or organizations: Not on file    Relationship status: Not on file  Other Topics Concern  . Not on file  Social History Narrative   Came to [place] about 2 months ago (March 2015) living with aunt and uncle here in the [place].  Mother, father, and 2 siblings still live in [place].    Family History: Family History  Problem Relation Age of Onset  . Diabetes Maternal Grandmother   . Diabetes Maternal Grandfather   . Depression Mother     Allergies: No Known Allergies  Medications Prior to Admission  Medication Sig Dispense Refill Last Dose  . Prenatal Multivit-Min-Fe-FA (PRENATAL 1 + IRON PO) Prenatal Vitamin tablet  Take 1 tablet every  day by oral route.   Past Week at Unknown time    Review of Systems  Pertinent pos/neg as indicated in HPI  Blood pressure 131/82, pulse 79, temperature (!) 97.5 F (36.4 C), temperature source Oral, resp. rate 20, height 5\' 5"  (1.651 m), weight 85.7 kg (189 lb), last menstrual period 07/12/2016, SpO2 98 %. General appearance: alert, cooperative and mild distress Lungs: clear to auscultation bilaterally Heart: regular rate and rhythm Abdomen: gravid, soft, non-tender Extremities: tr edema DTR's 2+, no clonus  Fetal monitoring: FHR: 140 bpm, variability: moderate,  Accelerations: Present,  decelerations:  Absent Uterine activity: q 2-65mins Dilation: 1 Effacement (%): 70 Station: -2 Exam by:: Cleone Slim, CNM Presentation: cephalic   Prenatal labs: ABO, Rh:  O+ Antibody:  neg Rubella:  non-imm RPR:   neg HBsAg:   neg HIV:   neg  GBS:   Pos  1hr glucola: 175 Genetic screening:  Quad neg Anatomy US: normal  Results for orders placed or performed during the hospital  encounter of 04/13/17 (from the past 24 hour(s))  Urinalysis, Routine w reflex microscopic   Collection Time: 04/13/17  1:23 AM  Result Value Ref Range   Color, Urine STRAW (A) YELLOW   APPearance CLEAR CLEAR   Specific Gravity, Urine 1.011 1.005 - 1.030   pH 7.0 5.0 - 8.0   Glucose, UA NEGATIVE NEGATIVE mg/dL   Hgb urine dipstick NEGATIVE NEGATIVE   Bilirubin Urine NEGATIVE NEGATIVE   Ketones, ur NEGATIVE NEGATIVE mg/dL   Protein, ur NEGATIVE NEGATIVE mg/dL   Nitrite NEGATIVE NEGATIVE   Leukocytes, UA NEGATIVE NEGATIVE  Protein / creatinine ratio, urine   Collection Time: 04/13/17  1:23 AM  Result Value Ref Range   Creatinine, Urine 43.00 mg/dL   Total Protein, Urine 20 mg/dL   Protein Creatinine Ratio 0.47 (H) 0.00 - 0.15 mg/mg[Cre]  CBC   Collection Time: 04/13/17  2:13 AM  Result Value Ref Range   WBC 12.8 (H) 4.0 - 10.5 K/uL   RBC 4.72 3.87 - 5.11 MIL/uL   Hemoglobin 13.1 12.0 - 15.0 g/dL   HCT 16.1 09.6 - 04.5 %   MCV 83.3 78.0 - 100.0 fL   MCH 27.8 26.0 - 34.0 pg   MCHC 33.3 30.0 - 36.0 g/dL   RDW 40.9 81.1 - 91.4 %   Platelets 291 150 - 400 K/uL  Comprehensive metabolic panel   Collection Time: 04/13/17  2:13 AM  Result Value Ref Range   Sodium 135 135 - 145 mmol/L   Potassium 4.0 3.5 - 5.1 mmol/L   Chloride 108 101 - 111 mmol/L   CO2 17 (L) 22 - 32 mmol/L   Glucose, Bld 104 (H) 65 - 99 mg/dL   BUN 10 6 - 20 mg/dL   Creatinine, Ser 7.82 0.44 - 1.00 mg/dL   Calcium 9.2 8.9 - 95.6 mg/dL   Total Protein 7.0 6.5 - 8.1 g/dL   Albumin 3.4 (L) 3.5 - 5.0 g/dL   AST 23 15 - 41 U/L   ALT 23 14 - 54 U/L   Alkaline Phosphatase 208 (H) 38 - 126 U/L   Total Bilirubin 0.4 0.3 - 1.2 mg/dL   GFR calc non Af Amer >60 >60 mL/min   GFR calc Af Amer >60 >60 mL/min   Anion gap 10 5 - 15     Assessment:  [redacted]w[redacted]d SIUP  G1P0000  Early labor  Pre-eclampsia w/o severe features  Cat 1 FHR  GBS  pos  Plan:  Admit  to BS  Discussed w/ Dr. Adrian BlackwaterStinson  Dx of pre-e w/o severe  features based on 2 mildly elevated bp's on arrival to [place], as well as pt report of elevated bp at last 2 ob visits and P:C 0.47, w/o other evidence of uti. BPs normal since initial 2 on arrival  IV pain meds/epidural prn active labor  Expectant management for now, pt very uncomfortable w/ uc's- will give IV pain meds, monitor for need for augmentation  Anticipate NSVB   Plans to breastfeed  Contraception: POPs  Circumcision: undecided  Cheral MarkerKimberly R Bazil Dhanani CNM, WHNP-BC 04/13/2017, 3:41 AM

## 2017-04-14 ENCOUNTER — Other Ambulatory Visit: Payer: Self-pay

## 2017-04-14 NOTE — Progress Notes (Signed)
Assisted with latch and hand expression. Audible suck when baby to the breast.

## 2017-04-14 NOTE — Anesthesia Postprocedure Evaluation (Signed)
Anesthesia Post Note  Patient: Peggy ConesGriselda Torres  Procedure(s) Performed: AN AD HOC LABOR EPIDURAL     Patient location during evaluation: Mother Baby Anesthesia Type: Epidural Level of consciousness: awake and alert and oriented Pain management: satisfactory to patient Vital Signs Assessment: post-procedure vital signs reviewed and stable Respiratory status: spontaneous breathing and nonlabored ventilation Cardiovascular status: stable Postop Assessment: no headache, no backache, no signs of nausea or vomiting, adequate PO intake and patient able to bend at knees (patient up walking) Anesthetic complications: no    Last Vitals:  Vitals:   04/13/17 2356 04/14/17 0551  BP: 138/69 (!) 106/58  Pulse: 99 86  Resp: 18 18  Temp: 36.9 C 36.6 C  SpO2: 100% 98%    Last Pain:  Vitals:   04/14/17 0551  TempSrc: Oral  PainSc:    Pain Goal: Patients Stated Pain Goal: 3 (04/14/17 0056)               Madison HickmanGREGORY,Britaney Espaillat

## 2017-04-14 NOTE — Progress Notes (Signed)
Post Partum Day 1 Subjective: no complaints, up ad lib, voiding and tolerating PO  Objective: Blood pressure (!) 106/58, pulse 86, temperature 97.8 F (36.6 C), temperature source Oral, resp. rate 18, height 5\' 5"  (1.651 m), weight 190 lb (86.2 kg), last menstrual period 07/12/2016, SpO2 98 %, unknown if currently breastfeeding.  Physical Exam:  General: alert, cooperative and no distress Lochia: appropriate Uterine Fundus: firm Incision: n/a DVT Evaluation: No evidence of DVT seen on physical exam.  Recent Labs    04/13/17 0857 04/13/17 2020  HGB 12.4 11.8*  HCT 36.7 35.5*    Assessment/Plan: Plan for discharge tomorrow and Breastfeeding   LOS: 1 day   Peggy Torres 04/14/2017, 7:17 AM

## 2017-04-14 NOTE — Lactation Note (Signed)
This note was copied from a baby's chart. Lactation Consultation Note  Patient Name: Peggy Torres Reason for consult: Follow-up assessment  Signs of swallowing discussed with parents & parents are very confident that infant is transferring milk at the breast. Mother has no complaints. Dad's question about feeding frequency answered.   [name], [name] Torres, 6:54 PM

## 2017-04-15 MED ORDER — IBUPROFEN 600 MG PO TABS: 600.0000 mg | ORAL_TABLET | Freq: Four times a day (QID) | ORAL | 0 refills | Status: DC

## 2017-04-15 NOTE — Lactation Note (Signed)
This note was copied from a baby's chart. Lactation Consultation Note  Patient Name: Boy [name]'BToday's Date: 04/15/2017 Reason for consult: Follow-up assessment;Primapara;Term Mom reports feedings are going well.  Reviewed basics and answered questions. Instructed on engorgement prevention and treatment.  Observed mom independently latch baby to breast using cradle hold.  Mom using good breast massage during feeding.  Lactation outpatient services and support reviewed and encouraged prn.  Maternal Data    Feeding Feeding Type: Breast Fed  LATCH Score Latch: Grasps breast easily, tongue down, lips flanged, rhythmical sucking.  Audible Swallowing: A few with stimulation  Type of Nipple: Everted at rest and after stimulation  Comfort (Breast/Nipple): Soft / non-tender  Hold (Positioning): No assistance needed to correctly position infant at breast.  LATCH Score: 9  Interventions    Lactation Tools Discussed/Used     Consult Status Consult Status: Complete Follow-up type: Call as needed    Huston FoleyMOULDEN, Dosha Broshears S 04/15/2017, 9:49 AM

## 2017-04-15 NOTE — Discharge Instructions (Signed)

## 2017-04-15 NOTE — Progress Notes (Signed)
CSW received consult for hx of Anxiety.  CSW met with MOB to offer support and complete assessment.    When CSW arrived, MOB was bonding with infant as evidence by engaging in breastfeeding.  FOB was also present and was observing MOB's and infant's interactions.  CSW explained CSW's role and MOB gave CSW permission to complete the assessment while FOB was present.   CSW inquired about MOB's thoughts and feelings about being a new mom.  MOB expressed that she is very happy and has a great amount of love for infant.  MOB reported having all necessary items for infant and feeling prepared to parent. MOB also reported having a good support team.   CSW asked about MOB feelings of anxiousness during pregnancy and MOB denied the feelings. MOB shared, "I had a great pregnancy and did not feel anxious or overwhelmed."   MOB appeared puzzled when CSW was asking about anxiety.   CSW utilized the opportunity provided education regarding the baby blues period vs. perinatal mood disorders, discussed treatment and gave resources for mental health follow up if concerns arise.  CSW recommends self-evaluation during the postpartum time period using the New Mom Checklist from Postpartum Progress and encouraged MOB to contact a medical professional if symptoms are noted at any time.  MOB reported being aware of PPD and shared she was educated by the health department.     CSW identifies no further need for intervention and no barriers to discharge at this time.  Laurey Arrow, MSW, LCSW Clinical Social Work [phone]

## 2017-04-15 NOTE — Discharge Summary (Addendum)
OB Discharge Summary     Patient Name: Peggy Torres DOB: July 16, 1995 MRN: [id number]  Date of admission: 04/13/2017 Delivering MD: Lyndel Safe NILES   Date of discharge: 04/15/2017  Admitting diagnosis: 39 WEEKS CTX Intrauterine pregnancy: [redacted]w[redacted]d     Secondary diagnosis:  Principal Problem:   Normal spontaneous vaginal delivery Active Problems:   Preeclampsia, third trimester  Additional problems: None     Discharge diagnosis: Term Pregnancy Delivered and Preeclampsia (mild)                                                                                                Post partum procedures:None  Augmentation: Pitocin  Complications: None  Hospital course:  Onset of Labor With Vaginal Delivery     22 y.o. yo G1P1001 at [redacted]w[redacted]d was admitted in Latent Labor on 04/13/2017. She had several elevated BPs at prior OB visits and on admission was noted to have  a P:C ratio of 0.47 without any symptoms. She remained normotensive and asymptomatic throughout her admission. Patient had a labor course as follows:  Membrane Rupture Time/Date: 3:10 PM ,04/13/2017   Intrapartum Procedures: Episiotomy: None [1]                                         Lacerations:  2nd degree [3];Labial [10]  Patient had a delivery of a Viable infant. 04/13/2017  Information for the patient's newborn:  Saralee, Bolick [[id number]]  Delivery Method: Vag-Spont    Pateint had an uncomplicated postpartum course.  She is ambulating, tolerating a regular diet, passing flatus, and urinating well. Patient is discharged home in stable condition on 04/15/17.   Physical exam  Vitals:   04/14/17 0551 04/14/17 0754 04/14/17 1951 04/15/17 0530  BP: (!) 106/58 (!) 106/57 (!) 115/58 (!) 129/57  Pulse: 86 80 92 90  Resp: 18 18 18 20   Temp: 97.8 F (36.6 C) 98 F (36.7 C) 98.4 F (36.9 C) 98.3 F (36.8 C)  TempSrc: Oral Oral Oral   SpO2: 98%   98%  Weight:      Height:       General: alert, cooperative  and no distress Lochia: appropriate Uterine Fundus: firm Incision: N/A DVT Evaluation: No evidence of DVT seen on physical exam. Labs: Lab Results  Component Value Date   WBC 19.1 (H) 04/13/2017   HGB 11.8 (L) 04/13/2017   HCT 35.5 (L) 04/13/2017   MCV 83.7 04/13/2017   PLT 229 04/13/2017   CMP Latest Ref Rng & Units 04/13/2017  Glucose 65 - 99 mg/dL 401(U)  BUN 6 - 20 mg/dL 10  Creatinine 2.72 - 5.36 mg/dL 6.44  Sodium 034 - 742 mmol/L 135  Potassium 3.5 - 5.1 mmol/L 4.0  Chloride 101 - 111 mmol/L 108  CO2 22 - 32 mmol/L 17(L)  Calcium 8.9 - 10.3 mg/dL 9.2  Total Protein 6.5 - 8.1 g/dL 7.0  Total Bilirubin 0.3 - 1.2 mg/dL 0.4  Alkaline Phos 38 - 126 U/L 208(H)  AST 15 -  41 U/L 23  ALT 14 - 54 U/L 23    Discharge instruction: per After Visit Summary and "Baby and Me Booklet".  After visit meds:  Allergies as of 04/15/2017   No Known Allergies     Medication List    TAKE these medications   ibuprofen 600 MG tablet Commonly known as:  ADVIL,MOTRIN Take 1 tablet (600 mg total) by mouth every 6 (six) hours.   PRENATAL 1 + IRON PO Prenatal Vitamin tablet  Take 1 tablet every day by oral route.       Diet: routine diet  Activity: Advance as tolerated. Pelvic rest for 6 weeks.   Outpatient follow up:1 week for BP check, 4 weeks for postpartum Follow up Appt:No future appointments. Follow up Visit: Follow-up Information    [facility] Schedule an appointment as soon as possible for a visit.   Why:  For postpartum care Contact information: [address] [place] [address] [phone]          Postpartum contraception: Progesterone only pills  Newborn Data: Live born female  Birth Weight: 8 lb 13.3 oz (4005 g) APGAR: 8, 8  Newborn Delivery   Birth date/time:  04/13/2017 16:00:00 Delivery type:  Vaginal, Spontaneous     Baby Feeding: Breast Disposition:home with mother   04/15/2017 Felicie MornHannah E Smith, Medical  Student  OB FELLOW DISCHARGE ATTESTATION  I have seen and examined this patient. I agree with above documentation and have made edits as needed.   Caryl AdaJazma Phelps, DO OB Fellow 7:15 AM

## 2017-04-15 NOTE — Plan of Care (Signed)
Pts condition will continue to improve 

## 2017-08-12 ENCOUNTER — Other Ambulatory Visit: Payer: Self-pay

## 2017-08-12 ENCOUNTER — Emergency Department (HOSPITAL_COMMUNITY): Payer: Medicaid Other

## 2017-08-12 ENCOUNTER — Emergency Department (HOSPITAL_COMMUNITY)
Admission: EM | Admit: 2017-08-12 | Discharge: 2017-08-12 | Disposition: A | Discharge status: Home / Self Care | Payer: Medicaid Other | Attending: Emergency Medicine | Admitting: Emergency Medicine

## 2017-08-12 ENCOUNTER — Encounter (HOSPITAL_COMMUNITY): Payer: Self-pay | Admitting: Nurse Practitioner

## 2017-08-12 DIAGNOSIS — Z87891 Personal history of nicotine dependence: Secondary | ICD-10-CM | POA: Diagnosis not present

## 2017-08-12 DIAGNOSIS — R109 Unspecified abdominal pain: Secondary | ICD-10-CM | POA: Diagnosis present

## 2017-08-12 DIAGNOSIS — Z79899 Other long term (current) drug therapy: Secondary | ICD-10-CM | POA: Insufficient documentation

## 2017-08-12 DIAGNOSIS — N200 Calculus of kidney: Secondary | ICD-10-CM | POA: Insufficient documentation

## 2017-08-12 LAB — CBC
HCT: 41.9 % (ref 36.0–46.0)
Hemoglobin: 13.9 g/dL (ref 12.0–15.0)
MCH: 27.1 pg (ref 26.0–34.0)
MCHC: 33.2 g/dL (ref 30.0–36.0)
MCV: 81.7 fL (ref 78.0–100.0)
Platelets: 323 10*3/uL (ref 150–400)
RBC: 5.13 MIL/uL — ABNORMAL HIGH (ref 3.87–5.11)
RDW: 14.3 % (ref 11.5–15.5)
WBC: 9.2 10*3/uL (ref 4.0–10.5)

## 2017-08-12 LAB — URINALYSIS, ROUTINE W REFLEX MICROSCOPIC
Bacteria, UA: NONE SEEN
Bilirubin Urine: NEGATIVE
Glucose, UA: NEGATIVE mg/dL
Ketones, ur: 5 mg/dL — AB
Nitrite: NEGATIVE
Protein, ur: 30 mg/dL — AB
RBC / HPF: >50 RBC/hpf — ABNORMAL HIGH (ref 0–5)
Specific Gravity, Urine: 1.024 (ref 1.005–1.030)
pH: 7 (ref 5.0–8.0)

## 2017-08-12 LAB — BASIC METABOLIC PANEL
Anion gap: 9 (ref 5–15)
BUN: 13 mg/dL (ref 6–20)
CO2: 23 mmol/L (ref 22–32)
Calcium: 9.1 mg/dL (ref 8.9–10.3)
Chloride: 110 mmol/L (ref 98–111)
Creatinine, Ser: 0.8 mg/dL (ref 0.44–1.00)
GFR calc Af Amer: >60 mL/min (ref >60)
GFR calc non Af Amer: >60 mL/min (ref >60)
Glucose, Bld: 127 mg/dL — ABNORMAL HIGH (ref 70–99)
Potassium: 4 mmol/L (ref 3.5–5.1)
Sodium: 142 mmol/L (ref 135–145)

## 2017-08-12 LAB — I-STAT BETA HCG BLOOD, ED (MC, WL, AP ONLY): I-stat hCG, quantitative: <5 m[IU]/mL (ref <5)

## 2017-08-12 MED ORDER — OXYCODONE-ACETAMINOPHEN 5-325 MG PO TABS: 1.0000 | ORAL_TABLET | ORAL | 0 refills | Status: DC | PRN

## 2017-08-12 MED ORDER — TAMSULOSIN HCL 0.4 MG PO CAPS: 0.4000 mg | ORAL_CAPSULE | Freq: Every day | ORAL | 0 refills | Status: DC

## 2017-08-12 MED ORDER — KETOROLAC TROMETHAMINE 30 MG/ML IJ SOLN
30.0000 mg | Freq: Once | INTRAMUSCULAR | Status: AC
  Administered 2017-08-12: 30 mg via INTRAVENOUS
  Filled 2017-08-12: qty 1

## 2017-08-12 MED ORDER — HYDROMORPHONE HCL 1 MG/ML IJ SOLN
1.0000 mg | Freq: Once | INTRAMUSCULAR | Status: AC
  Administered 2017-08-12: 1 mg via INTRAVENOUS
  Filled 2017-08-12: qty 1

## 2017-08-12 MED ORDER — OXYCODONE-ACETAMINOPHEN 5-325 MG PO TABS
1.0000 | ORAL_TABLET | Freq: Once | ORAL | Status: AC
  Administered 2017-08-12: 1 via ORAL
  Filled 2017-08-12: qty 1

## 2017-08-12 MED ORDER — ONDANSETRON 4 MG PO TBDP: 4.0000 mg | ORAL_TABLET | Freq: Three times a day (TID) | ORAL | 0 refills | Status: DC | PRN

## 2017-08-12 MED ORDER — ONDANSETRON HCL 4 MG/2ML IJ SOLN
4.0000 mg | Freq: Once | INTRAMUSCULAR | Status: AC
  Administered 2017-08-12: 4 mg via INTRAVENOUS
  Filled 2017-08-12: qty 2

## 2017-08-12 NOTE — ED Provider Notes (Signed)
[facility]-EMERGENCY DEPT Provider Note   CSN: [id number] Arrival date & time: 08/12/17  1711     History   Chief Complaint Chief Complaint  Patient presents with  . Flank Pain    HPI Peggy Torres is a 22 y.o. female.  The history is provided by the patient. No language interpreter was used.  Flank Pain  This is a new problem. The current episode started 3 to 5 hours ago. The problem occurs constantly. Associated symptoms include abdominal pain. Nothing aggravates the symptoms. Nothing relieves the symptoms. She has tried nothing for the symptoms. The treatment provided moderate relief.  Pt complains of severe abdominal pain.  Pt reports sudden onset of severe pain.  No history of kidney stones   History reviewed. No pertinent past medical history.  Patient Active Problem List   Diagnosis Date Noted  . Normal spontaneous vaginal delivery 04/15/2017  . Preeclampsia, third trimester 04/13/2017  . UTI (urinary tract infection) during pregnancy, second trimester 11/19/2016  . Other fatigue 06/24/2015  . Easy bruising 06/24/2015  . Wears glasses 05/25/2013  . Dysmenorrhea 05/25/2013  . Acne vulgaris 05/25/2013    Past Surgical History:  Procedure Laterality Date  . NO PAST SURGERIES    . WISDOM TOOTH EXTRACTION Bilateral      OB History    Gravida  1   Para  1   Term  1   Preterm  0   AB  0   Living  1     SAB  0   TAB  0   Ectopic  0   Multiple  0   Live Births  1            Home Medications    Prior to Admission medications   Medication Sig Start Date End Date Taking? Authorizing Provider  medroxyPROGESTERone (DEPO-PROVERA) 150 MG/ML injection Inject 150 mg into the muscle every 3 (three) months.   Yes [provider]  VITAMIN E PO Take 1 tablet by mouth daily.   Yes [provider]  ibuprofen (ADVIL,MOTRIN) 600 MG tablet Take 1 tablet (600 mg total) by mouth every 6 (six) hours. Patient not  taking: Reported on 08/12/2017 04/15/17   Pincus Large, DO    Family History Family History  Problem Relation Age of Onset  . Diabetes Maternal Grandmother   . Diabetes Maternal Grandfather   . Depression Mother     Social History Social History   Tobacco Use  . Smoking status: Former Smoker    Types: Cigarettes  . Smokeless tobacco: Never Used  . Tobacco comment: quit 2016  Substance Use Topics  . Alcohol use: Yes    Alcohol/week: 0.0 oz    Comment: not with preg  . Drug use: No     Allergies   Patient has no known allergies.   Review of Systems Review of Systems  Gastrointestinal: Positive for abdominal pain.  Genitourinary: Positive for flank pain.  All other systems reviewed and are negative.    Physical Exam Updated Vital Signs BP (!) 141/99 (BP Location: Right Arm)   Pulse 88   Temp 99.3 F (37.4 C) (Oral)   Resp 17   Ht 5\' 5"  (1.651 m)   Wt 69.9 kg (154 lb)   SpO2 99%   BMI 25.63 kg/m   Physical Exam  Constitutional: She is oriented to person, place, and time. She appears well-developed and well-nourished.  HENT:  Head: Normocephalic.  Right Ear: External ear  normal.  Left Ear: External ear normal.  Mouth/Throat: Oropharynx is clear and moist.  Eyes: Pupils are equal, round, and reactive to light. EOM are normal.  Neck: Normal range of motion.  Cardiovascular: Normal rate.  Pulmonary/Chest: Effort normal.  Abdominal: Soft. She exhibits no distension.  Musculoskeletal: Normal range of motion.  Neurological: She is alert and oriented to person, place, and time.  Skin: Skin is warm.  Psychiatric: She has a normal mood and affect.  Nursing note and vitals reviewed.    ED Treatments / Results  Labs (all labs ordered are listed, but only abnormal results are displayed) Labs Reviewed  URINALYSIS, ROUTINE W REFLEX MICROSCOPIC - Abnormal; Notable for the following components:      Result Value   APPearance HAZY (*)    Hgb urine dipstick  SMALL (*)    Ketones, ur 5 (*)    Protein, ur 30 (*)    Leukocytes, UA TRACE (*)    RBC / HPF >50 (*)    All other components within normal limits  BASIC METABOLIC PANEL - Abnormal; Notable for the following components:   Glucose, Bld 127 (*)    All other components within normal limits  CBC - Abnormal; Notable for the following components:   RBC 5.13 (*)    All other components within normal limits  I-STAT BETA HCG BLOOD, ED ([facility], [facility], [facility] ONLY)  I-STAT BETA HCG BLOOD, ED ([facility], [facility], [facility] ONLY)    EKG None  Radiology Ct Renal Stone Study  Result Date: 08/12/2017 CLINICAL DATA:  Initial evaluation for severe right flank pain. EXAM: CT ABDOMEN AND PELVIS WITHOUT CONTRAST TECHNIQUE: Multidetector CT imaging of the abdomen and pelvis was performed following the standard protocol without IV contrast. COMPARISON:  None. FINDINGS: Lower chest: Mild scattered atelectatic changes within the visualized lung bases. Visualized lungs are otherwise clear. Hepatobiliary: Liver demonstrates a normal unenhanced appearance. Focal fat deposition noted adjacent to the falciform ligament. Gallbladder within normal limits. No biliary dilatation. Pancreas: Pancreas within normal limits. Spleen: Unremarkable. Adrenals/Urinary Tract: Adrenal glands are normal. On the right, there is a 3 mm obstructive stone position within the distal right ureter with secondary moderate right hydroureteronephrosis. Associated perinephric and periureteral fat stranding. No other radiopaque calculi seen within the right kidney along the course of the right renal collecting system. Left kidney unremarkable without nephrolithiasis or hydronephrosis. No radiopaque calculi seen along the course of the left renal collecting system. No left-sided hydroureter. Bladder decompressed without abnormality. No layering stones within the bladder lumen. Stomach/Bowel: Stomach unremarkable. No evidence for bowel obstruction. Normal appendix. No acute  inflammatory changes seen about the bowels. Vascular/Lymphatic: Intra-abdominal aorta of normal caliber. No adenopathy. Reproductive: Uterus and ovaries within normal limits. Other: No free air or fluid. Musculoskeletal: No acute osseus abnormality. No worrisome lytic or blastic osseous lesions. IMPRESSION: 1. 3 mm obstructive stone within the distal right ureter with secondary moderate right hydroureteronephrosis. 2. No other radiopaque calculi seen within either kidney along the course of either renal collecting system. 3. No other acute abnormality within the abdomen and pelvis. Electronically Signed   By: Rise Mu M.D.   On: 08/12/2017 19:57    Procedures Procedures (including critical care time)  Medications Ordered in ED Medications  oxyCODONE-acetaminophen (PERCOCET/ROXICET) 5-325 MG per tablet 1 tablet (1 tablet Oral Given 08/12/17 1738)  HYDROmorphone (DILAUDID) injection 1 mg (1 mg Intravenous Given 08/12/17 1910)  ondansetron (ZOFRAN) injection 4 mg (4 mg Intravenous Given 08/12/17 1910)  ketorolac (TORADOL)  30 MG/ML injection 30 mg (30 mg Intravenous Given 08/12/17 2201)     Initial Impression / Assessment and Plan / ED Course  I have reviewed the triage vital signs and the nursing notes.  Pertinent labs & imaging results that were available during my care of the patient were reviewed by me and considered in my medical decision making (see chart for details).     MDM  3mm stone distal right ureter.  Pt reports she feel better after dilaudid and torodol.    Final Clinical Impressions(s) / ED Diagnoses   Final diagnoses:  Kidney stone    ED Discharge Orders        Ordered    oxyCODONE-acetaminophen (PERCOCET/ROXICET) 5-325 MG tablet  Every 4 hours PRN     08/12/17 2229    ondansetron (ZOFRAN ODT) 4 MG disintegrating tablet  Every 8 hours PRN     08/12/17 2229    tamsulosin (FLOMAX) 0.4 MG CAPS capsule  Daily     08/12/17 2229    An After Visit Summary was  printed and given to the patient.   Osie CheeksSofia, Helton Oleson K, PA-C 08/12/17 2231    Raeford RazorKohut, Stephen, MD 08/14/17 [phone]

## 2017-08-12 NOTE — ED Notes (Signed)
Pt waiting for pain medication to work before labs can be drawn.

## 2017-08-12 NOTE — ED Triage Notes (Signed)
Pt is c/o severe right flank pain, sudden onset about 20 minutes ago, radiates to the lower abdomen. Pt denies hx of kidney stones, denies N/V.

## 2017-08-12 NOTE — ED Notes (Signed)
Patient transported to CT 

## 2017-08-17 ENCOUNTER — Other Ambulatory Visit: Payer: Self-pay

## 2017-08-17 ENCOUNTER — Emergency Department (HOSPITAL_COMMUNITY)
Admission: EM | Admit: 2017-08-17 | Discharge: 2017-08-17 | Disposition: A | Discharge status: Home / Self Care | Payer: Medicaid Other | Attending: Emergency Medicine | Admitting: Emergency Medicine

## 2017-08-17 ENCOUNTER — Encounter (HOSPITAL_COMMUNITY): Payer: Self-pay | Admitting: Emergency Medicine

## 2017-08-17 DIAGNOSIS — Z87891 Personal history of nicotine dependence: Secondary | ICD-10-CM | POA: Diagnosis not present

## 2017-08-17 DIAGNOSIS — Z79899 Other long term (current) drug therapy: Secondary | ICD-10-CM | POA: Diagnosis not present

## 2017-08-17 DIAGNOSIS — N23 Unspecified renal colic: Secondary | ICD-10-CM | POA: Diagnosis not present

## 2017-08-17 DIAGNOSIS — R1084 Generalized abdominal pain: Secondary | ICD-10-CM | POA: Diagnosis present

## 2017-08-17 MED ORDER — ONDANSETRON 8 MG PO TBDP: 8.0000 mg | ORAL_TABLET | Freq: Three times a day (TID) | ORAL | 0 refills | Status: DC | PRN

## 2017-08-17 MED ORDER — IBUPROFEN 600 MG PO TABS: 600.0000 mg | ORAL_TABLET | Freq: Four times a day (QID) | ORAL | 0 refills | Status: AC | PRN

## 2017-08-17 MED ORDER — ACETAMINOPHEN 325 MG PO TABS: 650.0000 mg | ORAL_TABLET | Freq: Once | ORAL | Status: DC

## 2017-08-17 MED ORDER — KETOROLAC TROMETHAMINE 15 MG/ML IJ SOLN
15.0000 mg | Freq: Once | INTRAMUSCULAR | Status: AC
  Administered 2017-08-17: 15 mg via INTRAVENOUS
  Filled 2017-08-17: qty 1

## 2017-08-17 MED ORDER — HYDROCODONE-ACETAMINOPHEN 5-325 MG PO TABS
1.0000 | ORAL_TABLET | Freq: Once | ORAL | Status: AC
  Administered 2017-08-17: 1 via ORAL
  Filled 2017-08-17: qty 1

## 2017-08-17 MED ORDER — HYDROCODONE-ACETAMINOPHEN 5-325 MG PO TABS: 1.0000 | ORAL_TABLET | Freq: Three times a day (TID) | ORAL | 0 refills | Status: AC | PRN

## 2017-08-17 NOTE — Discharge Instructions (Addendum)
We saw you in the ER for the abdominal pain. °Our results indicate that you have a kidney stone. °We were able to get your pain is relative control, and we can safely send you home. ° °Take the meds prescribed. ° °If the pain is unbearable, you start having fevers, chills, and are unable to keep any meds down - then return to the ER. ° ° °

## 2017-08-17 NOTE — ED Triage Notes (Signed)
Pt complaining of right flank pain. Pt has confirmed kidney stone from previous visit. Stone has not yet passed.

## 2017-08-18 NOTE — ED Provider Notes (Signed)
[facility]-EMERGENCY DEPT Provider Note   CSN: [id number] Arrival date & time: 08/17/17  0344     History   Chief Complaint Chief Complaint  Patient presents with  . Flank Pain    HPI Peggy Torres is a 22 y.o. female.  HPI 22 year old female comes in with chief complaint of right-sided flank pain.  Patient reports that she was diagnosed with a kidney stone recently.  She has been taking the medications as prescribed, however continues to have persistent pain.  She has now run out of her pain medication and was unable to sleep last night because of severe pain.  Pain continues to be in the right flank region.  Patient has associated nausea without vomiting.  She called urology but was told that she will not be seen until several days later.  History reviewed. No pertinent past medical history.  Patient Active Problem List   Diagnosis Date Noted  . Normal spontaneous vaginal delivery 04/15/2017  . Preeclampsia, third trimester 04/13/2017  . UTI (urinary tract infection) during pregnancy, second trimester 11/19/2016  . Other fatigue 06/24/2015  . Easy bruising 06/24/2015  . Wears glasses 05/25/2013  . Dysmenorrhea 05/25/2013  . Acne vulgaris 05/25/2013    Past Surgical History:  Procedure Laterality Date  . NO PAST SURGERIES    . WISDOM TOOTH EXTRACTION Bilateral      OB History    Gravida  1   Para  1   Term  1   Preterm  0   AB  0   Living  1     SAB  0   TAB  0   Ectopic  0   Multiple  0   Live Births  1            Home Medications    Prior to Admission medications   Medication Sig Start Date End Date Taking? Authorizing Provider  medroxyPROGESTERone (DEPO-PROVERA) 150 MG/ML injection Inject 150 mg into the muscle every 3 (three) months.   Yes [provider]  oxyCODONE-acetaminophen (PERCOCET/ROXICET) 5-325 MG tablet Take 1 tablet by mouth every 4 (four) hours as needed for severe pain. 08/12/17  Yes  Elson AreasSofia, Leslie K, PA-C  tamsulosin (FLOMAX) 0.4 MG CAPS capsule Take 1 capsule (0.4 mg total) by mouth daily. 08/12/17  Yes Cheron SchaumannSofia, Leslie K, PA-C  VITAMIN E PO Take 1 tablet by mouth daily.   Yes [provider]  HYDROcodone-acetaminophen (NORCO/VICODIN) 5-325 MG tablet Take 1 tablet by mouth every 8 (eight) hours as needed for up to 3 days for severe pain. 08/17/17 08/20/17  Derwood KaplanNanavati, Kaelee Pfeffer, MD  ibuprofen (ADVIL,MOTRIN) 600 MG tablet Take 1 tablet (600 mg total) by mouth every 6 (six) hours as needed. 08/17/17   Derwood KaplanNanavati, Aleksey Newbern, MD  ondansetron (ZOFRAN ODT) 8 MG disintegrating tablet Take 1 tablet (8 mg total) by mouth every 8 (eight) hours as needed for nausea. 08/17/17   Derwood KaplanNanavati, Nastassja Witkop, MD    Family History Family History  Problem Relation Age of Onset  . Diabetes Maternal Grandmother   . Diabetes Maternal Grandfather   . Depression Mother     Social History Social History   Tobacco Use  . Smoking status: Former Smoker    Types: Cigarettes  . Smokeless tobacco: Never Used  . Tobacco comment: quit 2016  Substance Use Topics  . Alcohol use: Yes    Alcohol/week: 0.0 oz    Comment: not with preg  . Drug use: No  Allergies   Patient has no known allergies.   Review of Systems Review of Systems  Constitutional: Positive for activity change.  Cardiovascular: Negative for chest pain.  Gastrointestinal: Positive for abdominal pain.  Musculoskeletal: Positive for back pain.  Allergic/Immunologic: Negative for immunocompromised state.  Hematological: Does not bruise/bleed easily.  All other systems reviewed and are negative.    Physical Exam Updated Vital Signs BP 115/60 (BP Location: Right Arm)   Pulse 67   Temp 98.8 F (37.1 C) (Oral)   Resp 16   Wt 70.3 kg (155 lb)   SpO2 98%   BMI 25.79 kg/m   Physical Exam  Constitutional: She is oriented to person, place, and time. She appears well-developed.  HENT:  Head: Normocephalic and atraumatic.  Eyes: EOM  are normal.  Neck: Normal range of motion. Neck supple.  Cardiovascular: Normal rate.  Pulmonary/Chest: Effort normal.  Abdominal: Bowel sounds are normal.  Neurological: She is alert and oriented to person, place, and time.  Skin: Skin is warm and dry.  Nursing note and vitals reviewed.    ED Treatments / Results  Labs (all labs ordered are listed, but only abnormal results are displayed) Labs Reviewed - No data to display  EKG None  Radiology No results found.  Procedures Procedures (including critical care time)  Medications Ordered in ED Medications  ketorolac (TORADOL) 15 MG/ML injection 15 mg (15 mg Intravenous Given 08/17/17 0501)  HYDROcodone-acetaminophen (NORCO/VICODIN) 5-325 MG per tablet 1 tablet (1 tablet Oral Given 08/17/17 4259)     Initial Impression / Assessment and Plan / ED Course  I have reviewed the triage vital signs and the nursing notes.  Pertinent labs & imaging results that were available during my care of the patient were reviewed by me and considered in my medical decision making (see chart for details).  Clinical Course as of Aug 19 906  Wed Aug 17, 2017  5638 Patient continues to be pain-free.  We will discharge her home. She reports that her appointment with the urologist is next month, I will attempt to call the urology office to see if they can get her in so sooner.   [AN]    Clinical Course User Index [AN] Derwood Kaplan, MD    Patient with a chief complaint of right-sided flank pain.  Patient has history of stone on the right side, which she appears to be causing the pain.  Give patient Toradol and her pain resolved completely.  We decided to monitor patient in the ED for extended period time and she remained symptom-free.  Patient she will be discharged with more pain medications. I called the urology service, they will call the patient to see if they can see her sooner.   Final Clinical Impressions(s) / ED Diagnoses   Final  diagnoses:  Ureteral colic    ED Discharge Orders        Ordered    HYDROcodone-acetaminophen (NORCO/VICODIN) 5-325 MG tablet  Every 8 hours PRN     08/17/17 0710    ondansetron (ZOFRAN ODT) 8 MG disintegrating tablet  Every 8 hours PRN     08/17/17 0710    ibuprofen (ADVIL,MOTRIN) 600 MG tablet  Every 6 hours PRN     08/17/17 0710       Derwood Kaplan, MD 08/18/17 7564

## 2017-09-26 ENCOUNTER — Ambulatory Visit (HOSPITAL_COMMUNITY)
Admission: EM | Admit: 2017-09-26 | Discharge: 2017-09-26 | Disposition: A | Discharge status: Home / Self Care | Payer: Medicaid Other | Attending: Family Medicine | Admitting: Family Medicine

## 2017-09-26 ENCOUNTER — Encounter (HOSPITAL_COMMUNITY): Payer: Self-pay | Admitting: Emergency Medicine

## 2017-09-26 DIAGNOSIS — L239 Allergic contact dermatitis, unspecified cause: Secondary | ICD-10-CM | POA: Diagnosis not present

## 2017-09-26 MED ORDER — KETOTIFEN FUMARATE 0.025 % OP SOLN: 1.0000 [drp] | Freq: Two times a day (BID) | OPHTHALMIC | 0 refills | Status: AC

## 2017-09-26 MED ORDER — CETIRIZINE HCL 10 MG PO TABS
10.0000 mg | ORAL_TABLET | Freq: Every day | ORAL | 0 refills | Status: AC
Start: 2017-09-26 — End: ?

## 2017-09-26 NOTE — Discharge Instructions (Signed)
Appears that your eye lids have reacted to the lotion you had applied to them.  Avoid applying these products to the lids.  Use of Aquaphor may be helpful to prevent scratching and promote moisture to the lids, primarily before bed to keep in place.  Daily zyrtec and eye drops to help with itching as well.  If symptoms worsen or do not improve in the next week to return to be seen or to follow up with your PCP.

## 2017-09-26 NOTE — ED Provider Notes (Signed)
[facility]    CSN: [id number] Arrival date & time: 09/26/17  1240     History   Chief Complaint Chief Complaint  Patient presents with  . Eye Problem    HPI Peggy Torres is a 22 y.o. female.   Peggy Torres presents with complaints of bilateral eye lid itching and burning which started two days ago after applying two new lotions. One was a Administrator, sports scented lotion. Mild itching to eye balls but primarily to eye lids. No vision change or loss, no drainage. No pain. Tried another lotion her mother in law provided her to help which did not help. Denies any previous similar. No swelling. Without contributing medical history.      ROS per HPI.      History reviewed. No pertinent past medical history.  Patient Active Problem List   Diagnosis Date Noted  . Normal spontaneous vaginal delivery 04/15/2017  . Preeclampsia, third trimester 04/13/2017  . UTI (urinary tract infection) during pregnancy, second trimester 11/19/2016  . Other fatigue 06/24/2015  . Easy bruising 06/24/2015  . Wears glasses 05/25/2013  . Dysmenorrhea 05/25/2013  . Acne vulgaris 05/25/2013    Past Surgical History:  Procedure Laterality Date  . NO PAST SURGERIES    . WISDOM TOOTH EXTRACTION Bilateral     OB History    Gravida  1   Para  1   Term  1   Preterm  0   AB  0   Living  1     SAB  0   TAB  0   Ectopic  0   Multiple  0   Live Births  1            Home Medications    Prior to Admission medications   Medication Sig Start Date End Date Taking? Authorizing Provider  cetirizine (ZYRTEC) 10 MG tablet Take 1 tablet (10 mg total) by mouth daily. 09/26/17   Georgetta Haber, NP  ibuprofen (ADVIL,MOTRIN) 600 MG tablet Take 1 tablet (600 mg total) by mouth every 6 (six) hours as needed. 08/17/17   Derwood Kaplan, MD  ketotifen (ZADITOR) 0.025 % ophthalmic solution Place 1 drop into both eyes 2 (two) times daily. 09/26/17   Georgetta Haber, NP    medroxyPROGESTERone (DEPO-PROVERA) 150 MG/ML injection Inject 150 mg into the muscle every 3 (three) months.    [provider]  VITAMIN E PO Take 1 tablet by mouth daily.    [provider]    Family History Family History  Problem Relation Age of Onset  . Diabetes Maternal Grandmother   . Diabetes Maternal Grandfather   . Depression Mother     Social History Social History   Tobacco Use  . Smoking status: Former Smoker    Types: Cigarettes  . Smokeless tobacco: Never Used  . Tobacco comment: quit 2016  Substance Use Topics  . Alcohol use: Yes    Alcohol/week: 0.0 standard drinks    Comment: not with preg  . Drug use: No     Allergies   Patient has no known allergies.   Review of Systems Review of Systems   Physical Exam Triage Vital Signs ED Triage Vitals [09/26/17 1259]  Enc Vitals Group     BP 133/73     Pulse Rate 67     Resp 18     Temp 98.1 F (36.7 C)     Temp src      SpO2 100 %  Weight      Height      Head Circumference      Peak Flow      Pain Score      Pain Loc      Pain Edu?      Excl. in GC?    No data found.  Updated Vital Signs BP 133/73   Pulse 67   Temp 98.1 F (36.7 C)   Resp 18   LMP 08/31/2017   SpO2 100%   Visual Acuity Right Eye Distance:   Left Eye Distance:   Bilateral Distance:    Right Eye Near:   Left Eye Near:    Bilateral Near:     Physical Exam  Constitutional: She is oriented to person, place, and time. She appears well-developed and well-nourished. No distress.  HENT:  Head: Normocephalic and atraumatic.  Right Ear: External ear normal.  Left Ear: External ear normal.  Eyes: Pupils are equal, round, and reactive to light. Conjunctivae and EOM are normal. Right eye exhibits no discharge, no exudate and no hordeolum. Left eye exhibits no discharge, no exudate and no hordeolum.  Mild redness noted primarily to right upper lid just above lid line, scaly appearance    Cardiovascular: Normal rate, regular rhythm and normal heart sounds.  Pulmonary/Chest: Effort normal and breath sounds normal.  Neurological: She is alert and oriented to person, place, and time.  Skin: Skin is warm and dry.     UC Treatments / Results  Labs (all labs ordered are listed, but only abnormal results are displayed) Labs Reviewed - No data to display  EKG None  Radiology No results found.  Procedures Procedures (including critical care time)  Medications Ordered in UC Medications - No data to display  Initial Impression / Assessment and Plan / UC Course  I have reviewed the triage vital signs and the nursing notes.  Pertinent labs & imaging results that were available during my care of the patient were reviewed by me and considered in my medical decision making (see chart for details).     History and physical consistent with allergic response/ irritation from new lotions applied to eye lids. Encouraged to discontinue use. zaditor as needed for eye ball itching. Zyrtec daily to help with itching, use of aquaphor to help with moisture and itching. If symptoms worsen or do not improve in the next week to return to be seen or to follow up with PCP.  Patient verbalized understanding and agreeable to plan.    Final Clinical Impressions(s) / UC Diagnoses   Final diagnoses:  Allergic dermatitis     Discharge Instructions     Appears that your eye lids have reacted to the lotion you had applied to them.  Avoid applying these products to the lids.  Use of Aquaphor may be helpful to prevent scratching and promote moisture to the lids, primarily before bed to keep in place.  Daily zyrtec and eye drops to help with itching as well.  If symptoms worsen or do not improve in the next week to return to be seen or to follow up with your PCP.     ED Prescriptions    Medication Sig Dispense Auth. Provider   ketotifen (ZADITOR) 0.025 % ophthalmic solution Place 1 drop  into both eyes 2 (two) times daily. 5 mL Linus Mako B, NP   cetirizine (ZYRTEC) 10 MG tablet Take 1 tablet (10 mg total) by mouth daily. 30 tablet Georgetta Haber, NP  Controlled Substance Prescriptions [place] Controlled Substance Registry consulted? Not Applicable   Georgetta Haber, NP 09/26/17 1415

## 2017-09-26 NOTE — ED Triage Notes (Signed)
Pt c/o bilateral eye irritation, states she put lotion on her face and now both eyes are itching.

## 2018-06-02 ENCOUNTER — Emergency Department (HOSPITAL_COMMUNITY)
Admission: EM | Admit: 2018-06-02 | Discharge: 2018-06-03 | Disposition: A | Discharge status: Home / Self Care | Payer: Medicaid Other | Attending: Emergency Medicine | Admitting: Emergency Medicine

## 2018-06-02 ENCOUNTER — Encounter (HOSPITAL_COMMUNITY): Payer: Self-pay | Admitting: Emergency Medicine

## 2018-06-02 ENCOUNTER — Other Ambulatory Visit: Payer: Self-pay

## 2018-06-02 DIAGNOSIS — R112 Nausea with vomiting, unspecified: Secondary | ICD-10-CM | POA: Diagnosis not present

## 2018-06-02 DIAGNOSIS — K5900 Constipation, unspecified: Secondary | ICD-10-CM | POA: Diagnosis not present

## 2018-06-02 DIAGNOSIS — R1011 Right upper quadrant pain: Secondary | ICD-10-CM | POA: Insufficient documentation

## 2018-06-02 DIAGNOSIS — Z87891 Personal history of nicotine dependence: Secondary | ICD-10-CM | POA: Insufficient documentation

## 2018-06-02 LAB — CBC
HCT: 44.3 % (ref 36.0–46.0)
Hemoglobin: 14.5 g/dL (ref 12.0–15.0)
MCH: 28.2 pg (ref 26.0–34.0)
MCHC: 32.7 g/dL (ref 30.0–36.0)
MCV: 86 fL (ref 80.0–100.0)
Platelets: 290 10*3/uL (ref 150–400)
RBC: 5.15 MIL/uL — ABNORMAL HIGH (ref 3.87–5.11)
RDW: 13.3 % (ref 11.5–15.5)
WBC: 9.6 10*3/uL (ref 4.0–10.5)
nRBC: 0 % (ref 0.0–0.2)

## 2018-06-02 LAB — I-STAT BETA HCG BLOOD, ED (MC, WL, AP ONLY): I-stat hCG, quantitative: <5 m[IU]/mL (ref <5)

## 2018-06-02 MED ORDER — SODIUM CHLORIDE 0.9 % IV BOLUS
1000.0000 mL | Freq: Once | INTRAVENOUS | Status: AC
  Administered 2018-06-02: 1000 mL via INTRAVENOUS

## 2018-06-02 MED ORDER — SODIUM CHLORIDE 0.9% FLUSH
3.0000 mL | Freq: Once | INTRAVENOUS | Status: AC
  Administered 2018-06-02: 3 mL via INTRAVENOUS

## 2018-06-02 NOTE — ED Provider Notes (Signed)
[facility]-EMERGENCY DEPT Provider Note   CSN: [id number] Arrival date & time: 06/02/18  2158    History   Chief Complaint Chief Complaint  Patient presents with   Abdominal Pain    HPI Peggy Torres is a 23 y.o. female with a hx of dysmenorrhea, fatigue, UTI, kidney stones presents to the Emergency Department complaining of gradual, persistent, progressively worsening right upper quadrant abdominal pain onset 3 days ago.  Patient reports associated nausea.  She reports vomiting this morning but has not been vomiting for the last several days.  Patient reports emesis this morning was nonbloody nonbilious.  Pain is significantly worsened after eating. Patient denies fever, chills, headache, neck pain, chest pain, shortness of breath, weakness, dizziness, syncope, dysuria, hematuria.  Patient does report a history of kidney stones however has had no urinary symptoms or flank pain.  No previous history of abdominal surgeries.  Patient does report a history of chronic constipation.  She reports that after having a bowel movement her pain does improve slightly but not significantly.  She is not straining to have a bowel movement.  Patient reports her pain is a 4/10 and cramping in nature.  She denies radiation of her pain.  No treatments prior to arrival.     The history is provided by the patient and medical records. No language interpreter was used.    History reviewed. No pertinent past medical history.  Patient Active Problem List   Diagnosis Date Noted   Normal spontaneous vaginal delivery 04/15/2017   Preeclampsia, third trimester 04/13/2017   UTI (urinary tract infection) during pregnancy, second trimester 11/19/2016   Other fatigue 06/24/2015   Easy bruising 06/24/2015   Wears glasses 05/25/2013   Dysmenorrhea 05/25/2013   Acne vulgaris 05/25/2013    Past Surgical History:  Procedure Laterality Date   NO PAST SURGERIES     WISDOM TOOTH  EXTRACTION Bilateral      OB History    Gravida  1   Para  1   Term  1   Preterm  0   AB  0   Living  1     SAB  0   TAB  0   Ectopic  0   Multiple  0   Live Births  1            Home Medications    Prior to Admission medications   Medication Sig Start Date End Date Taking? Authorizing Provider  cetirizine (ZYRTEC) 10 MG tablet Take 1 tablet (10 mg total) by mouth daily. Patient not taking: Reported on 06/03/2018 09/26/17   Linus Mako B, NP  ibuprofen (ADVIL,MOTRIN) 600 MG tablet Take 1 tablet (600 mg total) by mouth every 6 (six) hours as needed. Patient not taking: Reported on 06/03/2018 08/17/17   Derwood Kaplan, MD  ketotifen (ZADITOR) 0.025 % ophthalmic solution Place 1 drop into both eyes 2 (two) times daily. Patient not taking: Reported on 06/03/2018 09/26/17   Linus Mako B, NP  medroxyPROGESTERone (DEPO-PROVERA) 150 MG/ML injection Inject 150 mg into the muscle every 3 (three) months.    [provider]  omeprazole (PRILOSEC) 20 MG capsule Take 1 capsule (20 mg total) by mouth daily. 06/03/18   Nathin Saran, Dahlia Client, PA-C  polyethylene glycol (MIRALAX / GLYCOLAX) 17 g packet Take 17 g by mouth 2 (two) times daily. 06/03/18   Yazmyne Sara, Dahlia Client, PA-C    Family History Family History  Problem Relation Age of Onset   Diabetes Maternal Grandmother  Diabetes Maternal Grandfather    Depression Mother     Social History Social History   Tobacco Use   Smoking status: Former Smoker    Types: Cigarettes   Smokeless tobacco: Never Used   Tobacco comment: quit 2016  Substance Use Topics   Alcohol use: Yes    Alcohol/week: 0.0 standard drinks    Comment: not with preg   Drug use: No     Allergies   Patient has no known allergies.   Review of Systems Review of Systems  Constitutional: Negative for appetite change, diaphoresis, fatigue, fever and unexpected weight change.  HENT: Negative for mouth sores.   Eyes: Negative for  visual disturbance.  Respiratory: Negative for cough, chest tightness, shortness of breath and wheezing.   Cardiovascular: Negative for chest pain.  Gastrointestinal: Positive for abdominal pain, constipation, nausea and vomiting. Negative for diarrhea.  Endocrine: Negative for polydipsia, polyphagia and polyuria.  Genitourinary: Negative for dysuria, frequency, hematuria and urgency.  Musculoskeletal: Negative for back pain and neck stiffness.  Skin: Negative for rash.  Allergic/Immunologic: Negative for immunocompromised state.  Neurological: Negative for syncope, light-headedness and headaches.  Hematological: Does not bruise/bleed easily.  Psychiatric/Behavioral: Negative for sleep disturbance. The patient is not nervous/anxious.      Physical Exam Updated Vital Signs BP (!) 149/99 (BP Location: Left Arm)    Pulse (!) 101    Temp 99.1 F (37.3 C) (Oral)    Resp 19    SpO2 100%   Physical Exam Vitals signs and nursing note reviewed.  Constitutional:      General: She is not in acute distress.    Appearance: She is well-developed. She is not diaphoretic.     Comments: Awake, alert, nontoxic appearance  HENT:     Head: Normocephalic and atraumatic.     Mouth/Throat:     Pharynx: No oropharyngeal exudate.  Eyes:     General: No scleral icterus.    Conjunctiva/sclera: Conjunctivae normal.  Neck:     Musculoskeletal: Normal range of motion and neck supple.  Cardiovascular:     Rate and Rhythm: Normal rate and regular rhythm.  Pulmonary:     Effort: Pulmonary effort is normal. No respiratory distress.     Breath sounds: Normal breath sounds. No wheezing.  Abdominal:     General: Bowel sounds are normal.     Palpations: Abdomen is soft. There is no mass.     Tenderness: There is abdominal tenderness in the right upper quadrant. There is no right CVA tenderness, left CVA tenderness, guarding or rebound. Negative signs include Murphy's sign and McBurney's sign.     Hernia: No  hernia is present.  Musculoskeletal: Normal range of motion.  Skin:    General: Skin is warm and dry.  Neurological:     Mental Status: She is alert.     Comments: Speech is clear and goal oriented Moves extremities without ataxia      ED Treatments / Results  Labs (all labs ordered are listed, but only abnormal results are displayed) Labs Reviewed  COMPREHENSIVE METABOLIC PANEL - Abnormal; Notable for the following components:      Result Value   Glucose, Bld 110 (*)    All other components within normal limits  CBC - Abnormal; Notable for the following components:   RBC 5.15 (*)    All other components within normal limits  LIPASE, BLOOD  URINALYSIS, ROUTINE W REFLEX MICROSCOPIC  I-STAT BETA HCG BLOOD, ED ([facility], [facility], [facility] ONLY)  Radiology US Abdomen Limited  Result Date: 06/03/2018 CLINICAL DATA:  Right upper quadrant pain for 3 days. Nausea and vomiting. EXAM: ULTRASOUND ABDOMEN LIMITED RIGHT UPPER QUADRANT COMPARISON:  None. FINDINGS: Gallbladder: Physiologically distended. No gallstones or wall thickening visualized. No sonographic Murphy sign noted by sonographer. Common bile duct: Diameter: 4 mm, normal. Liver: No focal lesion identified. Within normal limits in parenchymal echogenicity. Portal vein is patent on color Doppler imaging with normal direction of blood flow towards the liver. IMPRESSION: Unremarkable right upper quadrant ultrasound.  No gallstones. Electronically Signed   By: Narda Rutherford M.D.   On: 06/03/2018 00:45   Dg Abd Acute 2+v W 1v Chest  Result Date: 06/03/2018 CLINICAL DATA:  Abdominal pain and vomiting. EXAM: DG ABDOMEN ACUTE W/ 1V CHEST COMPARISON:  None. FINDINGS: The cardiomediastinal contours are normal. The lungs are clear. There is no free intra-abdominal air. No dilated bowel loops to suggest obstruction. Moderate stool in the right colon. Small volume of stool distally. No radiopaque calculi. No acute osseous abnormalities are seen.  IMPRESSION: 1. Normal bowel gas pattern.  Moderate stool in the proximal colon. 2. Clear lungs. Electronically Signed   By: Narda Rutherford M.D.   On: 06/03/2018 02:11    Procedures Procedures (including critical care time)  Medications Ordered in ED Medications  sodium chloride flush (NS) 0.9 % injection 3 mL (3 mLs Intravenous Given 06/02/18 2308)  sodium chloride 0.9 % bolus 1,000 mL (0 mLs Intravenous Stopped 06/03/18 0148)  alum & mag hydroxide-simeth (MAALOX/MYLANTA) 200-200-20 MG/5ML suspension 30 mL (30 mLs Oral Given 06/03/18 0202)    And  lidocaine (XYLOCAINE) 2 % viscous mouth solution 15 mL (15 mLs Oral Given 06/03/18 0202)     Initial Impression / Assessment and Plan / ED Course  I have reviewed the triage vital signs and the nursing notes.  Pertinent labs & imaging results that were available during my care of the patient were reviewed by me and considered in my medical decision making (see chart for details).  Clinical Course as of Jun 02 233  Fri Jun 02, 2018  2334 Pt declines pain and nausea medication at this time   [HM]    Clinical Course User Index [HM] Paytyn Mesta, Dahlia Client, [place]       Patient presents with right upper quadrant abdominal pain onset several days ago, worse with eating.  Patient has no history of abdominal surgeries or known peptic ulcer disease.  Labs are reassuring.  No leukocytosis.  No evidence of urinary tract infection.  No electrolyte abnormalities.  Patient initially tachycardic on triage exam however no tachycardia on my physical exam.  Temperature measured at 99.1 here in the emergency room however patient denies fevers at home.  She declines rectal temp.  Pregnancy test negative.  On exam patient with right upper quadrant abdominal tenderness but no rebound or guarding.  No specific Murphy sign.  Right upper quadrant ultrasound is without evidence of cholecystitis, choledocholithiasis or ascending cholangitis.  Plain films of abdomen are  without free air and do show moderate constipation.  As patient's pain worsens after eating, some concern for constipation versus peptic ulcer disease.  No clinical or radiographic evidence of perforated ulcer.  Patient denies melena or hematochezia and hemoglobin is normal.  Doubt bleeding ulcer.  On repeat exam, abdomen is soft and only minimally tender.  She remains without rebound or guarding.  She has not required any pain control here in the emergency department.  Suspect patient's pain is more  likely to be caused by her severe constipation.  Will give omeprazole and MiraLAX.  She is to have close primary care follow-up in the next several days.  Discussed reasons to return immediately to the emergency department and she states understanding.  Final Clinical Impressions(s) / ED Diagnoses   Final diagnoses:  RUQ abdominal pain  Constipation, unspecified constipation type    ED Discharge Orders         Ordered    omeprazole (PRILOSEC) 20 MG capsule  Daily     06/03/18 0232    polyethylene glycol (MIRALAX / GLYCOLAX) 17 g packet  2 times daily     06/03/18 0232           Esaul Dorwart, Dahlia ClientHannah, PA-C 06/03/18 0235    Ward, Layla MawKristen N, DO 06/03/18 [id number]

## 2018-06-02 NOTE — ED Triage Notes (Addendum)
Patient here from home with complaints of right upper abd pain that started yesterday. Nausea, vomiting this morning. Non radiating

## 2018-06-03 ENCOUNTER — Emergency Department (HOSPITAL_COMMUNITY): Payer: Medicaid Other

## 2018-06-03 LAB — COMPREHENSIVE METABOLIC PANEL
ALT: 23 U/L (ref 0–44)
AST: 18 U/L (ref 15–41)
Albumin: 4.7 g/dL (ref 3.5–5.0)
Alkaline Phosphatase: 83 U/L (ref 38–126)
Anion gap: 9 (ref 5–15)
BUN: 11 mg/dL (ref 6–20)
CO2: 22 mmol/L (ref 22–32)
Calcium: 9.4 mg/dL (ref 8.9–10.3)
Chloride: 109 mmol/L (ref 98–111)
Creatinine, Ser: 0.62 mg/dL (ref 0.44–1.00)
GFR calc Af Amer: >60 mL/min (ref >60)
GFR calc non Af Amer: >60 mL/min (ref >60)
Glucose, Bld: 110 mg/dL — ABNORMAL HIGH (ref 70–99)
Potassium: 3.5 mmol/L (ref 3.5–5.1)
Sodium: 140 mmol/L (ref 135–145)
Total Bilirubin: 0.4 mg/dL (ref 0.3–1.2)
Total Protein: 7.7 g/dL (ref 6.5–8.1)

## 2018-06-03 LAB — URINALYSIS, ROUTINE W REFLEX MICROSCOPIC
Bilirubin Urine: NEGATIVE
Glucose, UA: NEGATIVE mg/dL
Hgb urine dipstick: NEGATIVE
Ketones, ur: NEGATIVE mg/dL
Leukocytes,Ua: NEGATIVE
Nitrite: NEGATIVE
Protein, ur: NEGATIVE mg/dL
Specific Gravity, Urine: 1.023 (ref 1.005–1.030)
pH: 6 (ref 5.0–8.0)

## 2018-06-03 LAB — LIPASE, BLOOD: Lipase: 35 U/L (ref 11–51)

## 2018-06-03 MED ORDER — POLYETHYLENE GLYCOL 3350 17 G PO PACK: 17.0000 g | PACK | Freq: Two times a day (BID) | ORAL | 0 refills | Status: AC

## 2018-06-03 MED ORDER — ALUM & MAG HYDROXIDE-SIMETH 200-200-20 MG/5ML PO SUSP
30.0000 mL | Freq: Once | ORAL | Status: AC
  Administered 2018-06-03: 30 mL via ORAL
  Filled 2018-06-03: qty 30

## 2018-06-03 MED ORDER — OMEPRAZOLE 20 MG PO CPDR: 20.0000 mg | DELAYED_RELEASE_CAPSULE | Freq: Every day | ORAL | 0 refills | Status: AC

## 2018-06-03 MED ORDER — LIDOCAINE VISCOUS HCL 2 % MT SOLN
15.0000 mL | Freq: Once | OROMUCOSAL | Status: AC
Start: 2018-06-03 — End: 2018-06-03
  Administered 2018-06-03: 02:00:00 15 mL via ORAL
  Filled 2018-06-03: qty 15

## 2018-06-03 NOTE — ED Notes (Signed)
[place] at beside, will get urine specimen once [place] has left

## 2018-06-03 NOTE — Discharge Instructions (Addendum)
1. Medications: omeprazole, miralax, usual home medications 2. Treatment: rest, drink plenty of fluids, advance diet slowly 3. Follow Up: Please followup with your primary doctor in 2 days for discussion of your diagnoses and further evaluation after today's visit; if you do not have a primary care doctor use the resource guide provided to find one; Please return to the ER for persistent vomiting, high fevers or worsening symptoms

## 2018-06-03 NOTE — ED Notes (Signed)
Pt refuses rectal temp

## 2018-11-14 IMAGING — US US MFM OB DETAIL+14 WK
1 series · 14 of 28 positions shown · non-contrast
Comparison: none

[Series 1: us mfm ob detail+14 wk · 67 acquisitions, 14 frames shown]
[im 3/67]
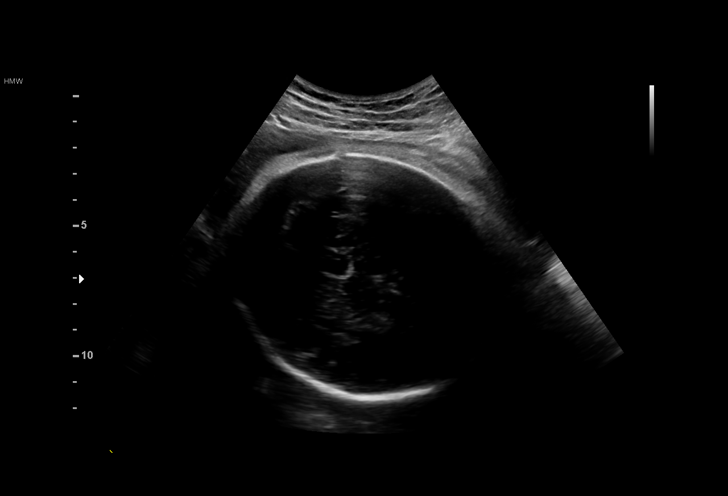
[im 8/67]
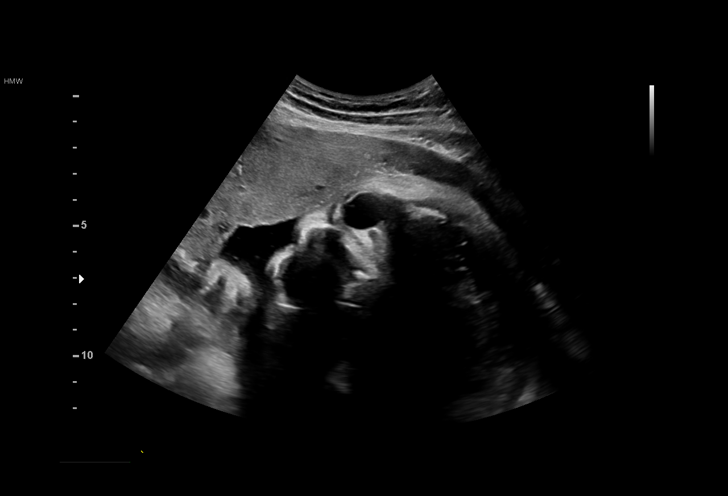
[im 13/67]
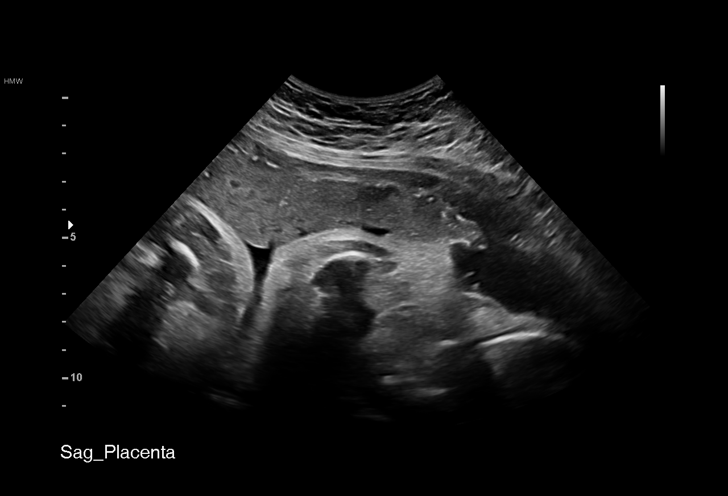
[im 18/67]
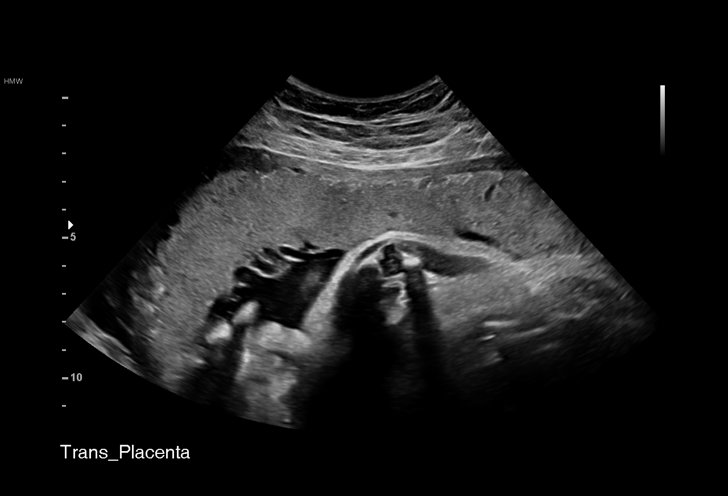
[im 23/67]
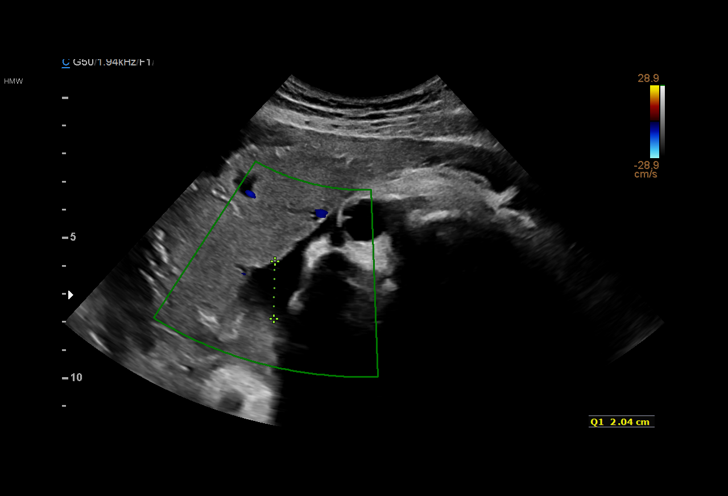
[im 27/67]
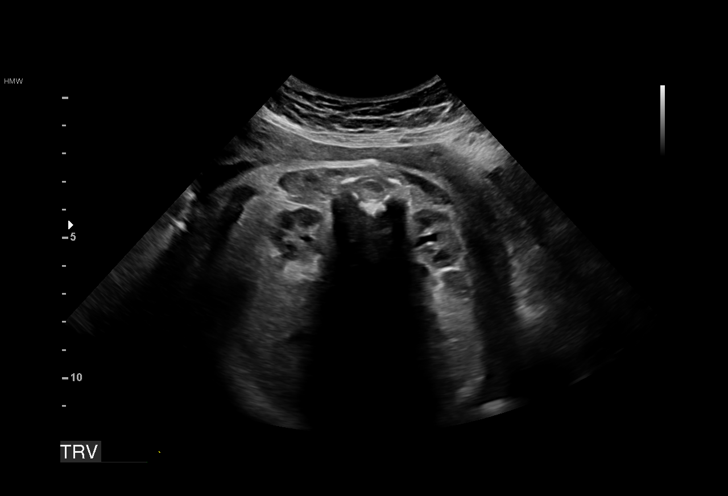
[im 32/67]
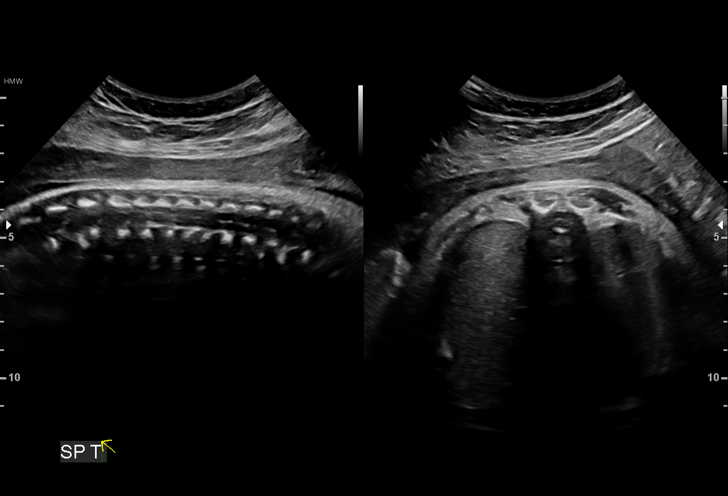
[im 37/67]
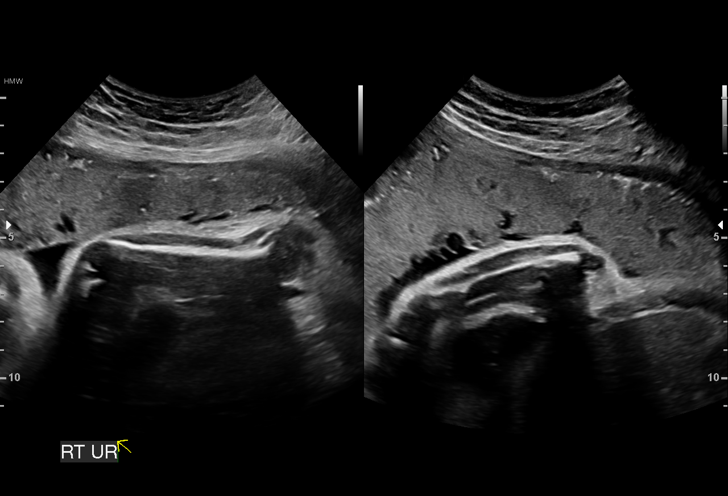
[im 42/67]
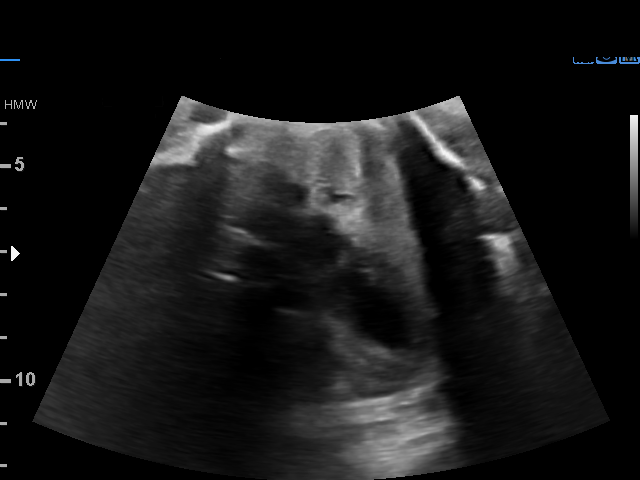
[im 47/67]
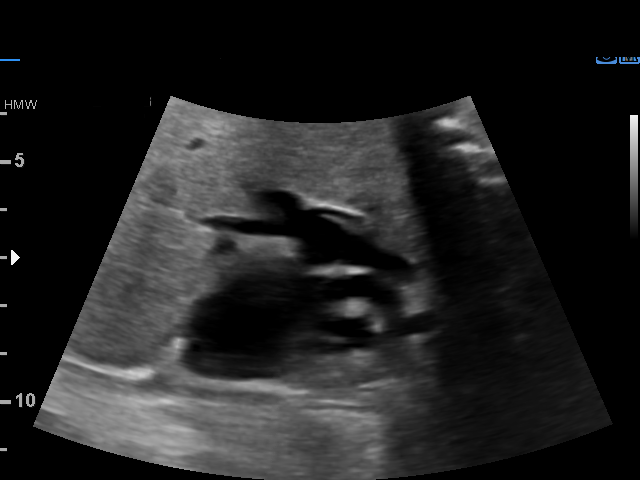
[im 52/67]
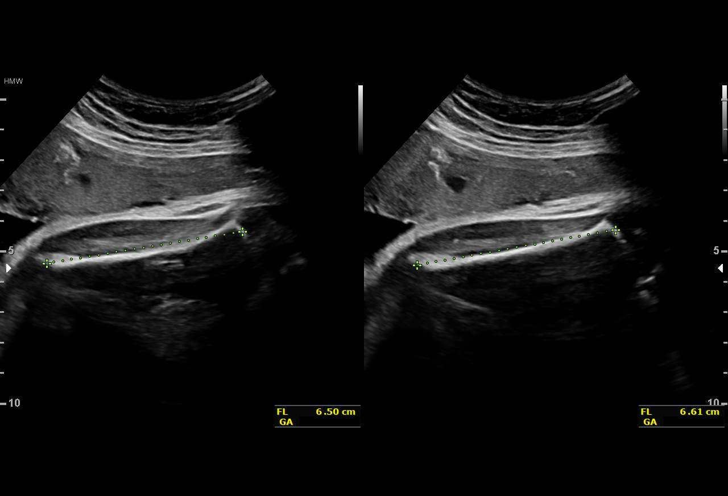
[im 57/67]
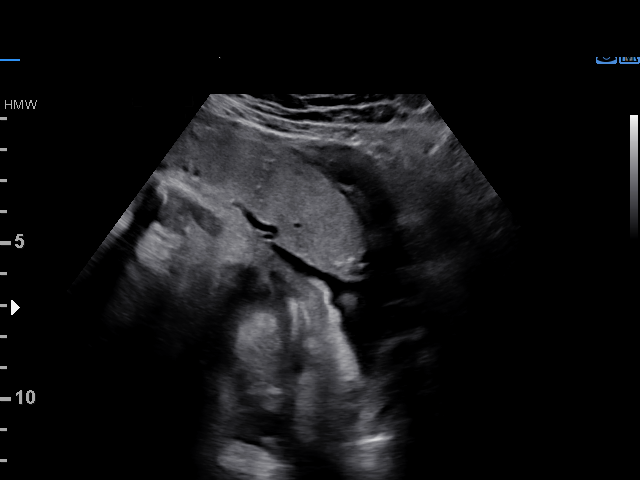
[im 62/67]
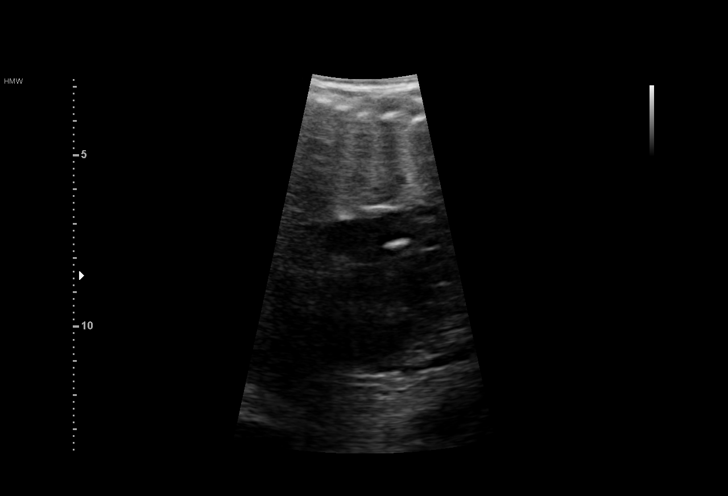
[im 67/67]
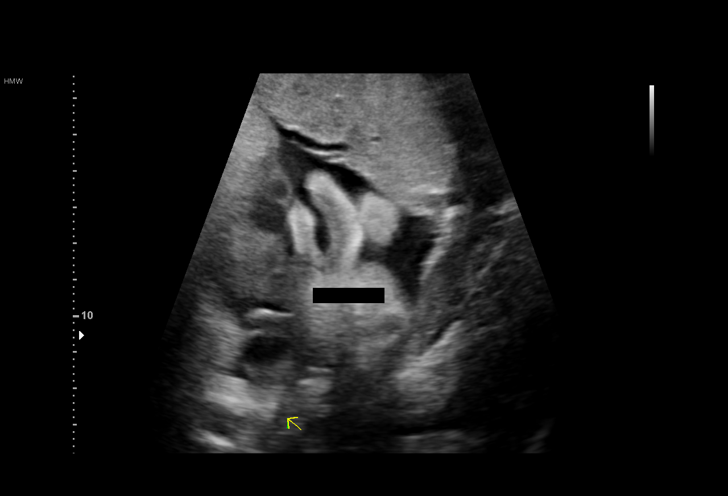

[14 of 28 positions shown; findings below may reference images not displayed]

[REDACTED]-
Faculty Physician

1  AXEL DAVID                [id number]      [phone]     [phone]
MALVIN
Indications

35 weeks gestation of pregnancy
Encounter for antenatal screening for
malformations
Obesity complicating pregnancy, third
trimester (pregravid BMI 30)
Insufficient Prenatal Care
OB History

Blood Type:            Height:  5'3"   Weight (lb):  172       BMI:
Gravidity:    1
Fetal Evaluation

Num Of Fetuses:     1
Fetal Heart         151
Rate(bpm):
Cardiac Activity:   Observed
Presentation:       Cephalic
Placenta:           Anterior, above cervical os
P. Cord Insertion:  Visualized, central

Amniotic Fluid
AFI FV:      Subjectively within normal limits

AFI Sum(cm)     %Tile       Largest Pocket(cm)
7.4             4

RUQ(cm)       RLQ(cm)       LUQ(cm)        LLQ(cm)
2.04          1.72          0
Biometry

BPD:      89.3  mm     G. Age:  36w 1d         80  %    CI:        78.77   %    70 - 86
FL/HC:      20.6   %    20.1 -
HC:      318.2  mm     G. Age:  35w 6d         33  %    HC/AC:      0.92        0.93 -
AC:      346.3  mm     G. Age:  38w 4d       > 97  %    FL/BPD:     73.3   %    71 - 87
FL:       65.5  mm     G. Age:  33w 5d         13  %    FL/AC:      18.9   %    20 - 24
HUM:      57.1  mm     G. Age:  33w 1d         26  %

Est. FW:    8101  gm    6 lb 12 oz      87  %
Gestational Age

LMP:           35w 1d        Date:  07/12/16                 EDD:   04/18/17
Clinical EDD:  35w 1d                                        EDD:   04/18/17
U/S Today:     36w 1d                                        EDD:   04/11/17
Best:          35w 1d     Det. By:  LMP  (07/12/16)          EDD:   04/18/17
Anatomy

Cranium:               Appears normal         Aortic Arch:            Appears normal
Cavum:                 Appears normal         Ductal Arch:            Not well visualized
Ventricles:            Appears normal         Diaphragm:              Appears normal
Choroid Plexus:        Not well visualized    Stomach:                Appears normal, left
sided
Cerebellum:            Appears normal         Abdomen:                Appears normal
Posterior Fossa:       Not well visualized    Abdominal Wall:         Not well visualized
Nuchal Fold:           Not applicable (>20    Cord Vessels:           Appears normal (3
wks GA)                                        vessel cord)
Face:                  Orbits nl; profile not Kidneys:                Appear normal
well visualized
Lips:                  Appears normal         Bladder:                Appears normal
Thoracic:              Appears normal         Spine:                  Appears normal
Heart:                 Appears normal         Upper Extremities:      Not well visualized
(4CH, axis, and
situs)
RVOT:                  Appears normal         Lower Extremities:      Visualized
LVOT:                  Appears normal

Other:  Complete fetal anatomic survey previously performed. Technically
difficult due to advanced GA and fetal position.
Cervix Uterus Adnexa

Cervix
Not visualized (advanced GA >48wks)

Uterus
No abnormality visualized.

Left Ovary
No adnexal mass visualized.

Right Ovary
No adnexal mass visualized.

Cul De Sac:   No free fluid seen.

Adnexa:       No abnormality visualized.
Impression

SIUP at 35+1 weeks with cardiac activity
Cephalic presentation
Normal detailed fetal anatomy; limited views of PF, profile,
DA and CI
Normal amniotic fluid volume
Measurements consistent with LMP and US; EFW at the 87th
%tile; AC > 97th %tile
Recommendations

Follow-up as clinically indicated
